# Patient Record
Sex: Male | Born: 1940 | Race: Black or African American | Hispanic: No | Marital: Single | Smoking: Never smoker
Health system: Southern US, Community
[De-identification: ages and names within clinical notes are randomized; demographics above are authoritative.]

## PROBLEM LIST (undated history)

## (undated) DIAGNOSIS — I10 Essential (primary) hypertension: Secondary | ICD-10-CM

## (undated) HISTORY — DX: Essential (primary) hypertension: I10

## (undated) HISTORY — PX: DENTAL RESTORATION/EXTRACTION WITH X-RAY: SHX5796

---

## 2003-07-06 LAB — HM COLONOSCOPY

## 2003-12-21 ENCOUNTER — Emergency Department (HOSPITAL_COMMUNITY): Admission: EM | Admit: 2003-12-21 | Discharge: 2003-12-21 | Payer: Self-pay | Admitting: Family Medicine

## 2003-12-24 ENCOUNTER — Emergency Department (HOSPITAL_COMMUNITY): Admission: EM | Admit: 2003-12-24 | Discharge: 2003-12-24 | Payer: Self-pay | Admitting: Family Medicine

## 2004-04-24 ENCOUNTER — Emergency Department (HOSPITAL_COMMUNITY): Admission: EM | Admit: 2004-04-24 | Discharge: 2004-04-24 | Payer: Self-pay | Admitting: Family Medicine

## 2004-05-01 ENCOUNTER — Emergency Department (HOSPITAL_COMMUNITY): Admission: EM | Admit: 2004-05-01 | Discharge: 2004-05-01 | Payer: Self-pay | Admitting: Family Medicine

## 2004-05-05 ENCOUNTER — Ambulatory Visit: Payer: Self-pay | Admitting: Internal Medicine

## 2004-05-11 ENCOUNTER — Ambulatory Visit: Payer: Self-pay | Admitting: Family Medicine

## 2005-10-04 ENCOUNTER — Ambulatory Visit: Payer: Self-pay | Admitting: Internal Medicine

## 2005-10-18 ENCOUNTER — Ambulatory Visit: Payer: Self-pay | Admitting: Family Medicine

## 2005-12-06 ENCOUNTER — Ambulatory Visit: Payer: Self-pay | Admitting: Family Medicine

## 2005-12-06 LAB — CONVERTED CEMR LAB: PSA: 0.94 ng/mL

## 2005-12-13 ENCOUNTER — Ambulatory Visit: Payer: Self-pay | Admitting: Family Medicine

## 2006-01-03 ENCOUNTER — Ambulatory Visit: Payer: Self-pay | Admitting: Family Medicine

## 2007-01-23 ENCOUNTER — Ambulatory Visit: Payer: Self-pay | Admitting: Family Medicine

## 2007-01-23 DIAGNOSIS — M109 Gout, unspecified: Secondary | ICD-10-CM | POA: Insufficient documentation

## 2007-01-26 ENCOUNTER — Encounter: Payer: Self-pay | Admitting: Family Medicine

## 2007-01-26 DIAGNOSIS — I10 Essential (primary) hypertension: Secondary | ICD-10-CM

## 2007-01-27 ENCOUNTER — Ambulatory Visit: Payer: Self-pay | Admitting: Family Medicine

## 2007-02-21 ENCOUNTER — Encounter: Payer: Self-pay | Admitting: Family Medicine

## 2007-03-17 ENCOUNTER — Ambulatory Visit: Payer: Self-pay | Admitting: Family Medicine

## 2007-03-17 LAB — CONVERTED CEMR LAB
ALT: 19 units/L (ref 0–53)
AST: 24 units/L (ref 0–37)
Albumin: 3.7 g/dL (ref 3.5–5.2)
Basophils Absolute: 0 10*3/uL (ref 0.0–0.1)
Calcium: 9.2 mg/dL (ref 8.4–10.5)
Chloride: 103 meq/L (ref 96–112)
Creatinine, Ser: 1.3 mg/dL (ref 0.4–1.5)
Eosinophils Absolute: 0.1 10*3/uL (ref 0.0–0.6)
Eosinophils Relative: 1.5 % (ref 0.0–5.0)
GFR calc non Af Amer: 59 mL/min
Glucose, Bld: 90 mg/dL (ref 70–99)
HCT: 36.7 % — ABNORMAL LOW (ref 39.0–52.0)
LDL Cholesterol: 108 mg/dL — ABNORMAL HIGH (ref 0–99)
MCV: 94.7 fL (ref 78.0–100.0)
Neutrophils Relative %: 57.7 % (ref 43.0–77.0)
Platelets: 361 10*3/uL (ref 150–400)
RBC: 3.87 M/uL — ABNORMAL LOW (ref 4.22–5.81)
RDW: 12.4 % (ref 11.5–14.6)
Total Bilirubin: 1.7 mg/dL — ABNORMAL HIGH (ref 0.3–1.2)
Total CHOL/HDL Ratio: 3.7
Triglycerides: 43 mg/dL (ref 0–149)
WBC: 5.5 10*3/uL (ref 4.5–10.5)

## 2007-03-24 ENCOUNTER — Ambulatory Visit: Payer: Self-pay | Admitting: Family Medicine

## 2007-06-19 ENCOUNTER — Ambulatory Visit: Payer: Self-pay | Admitting: Family Medicine

## 2007-06-19 DIAGNOSIS — D649 Anemia, unspecified: Secondary | ICD-10-CM | POA: Insufficient documentation

## 2007-06-19 LAB — CONVERTED CEMR LAB
Eosinophils Absolute: 0.1 10*3/uL (ref 0.0–0.6)
Eosinophils Relative: 1.8 % (ref 0.0–5.0)
HCT: 43.6 % (ref 39.0–52.0)
Hemoglobin: 14.7 g/dL (ref 13.0–17.0)
Lymphocytes Relative: 24.4 % (ref 12.0–46.0)
MCHC: 33.7 g/dL (ref 30.0–36.0)
MCV: 96.6 fL (ref 78.0–100.0)
Monocytes Absolute: 0.6 10*3/uL (ref 0.2–0.7)
Neutro Abs: 3.6 10*3/uL (ref 1.4–7.7)
Neutrophils Relative %: 62.9 % (ref 43.0–77.0)
WBC: 5.7 10*3/uL (ref 4.5–10.5)

## 2007-08-14 ENCOUNTER — Ambulatory Visit: Payer: Self-pay | Admitting: Family Medicine

## 2007-08-14 DIAGNOSIS — N63 Unspecified lump in unspecified breast: Secondary | ICD-10-CM | POA: Insufficient documentation

## 2008-05-21 ENCOUNTER — Telehealth: Payer: Self-pay | Admitting: Family Medicine

## 2008-05-23 ENCOUNTER — Ambulatory Visit: Payer: Self-pay | Admitting: Family Medicine

## 2008-06-21 ENCOUNTER — Emergency Department (HOSPITAL_COMMUNITY): Admission: EM | Admit: 2008-06-21 | Discharge: 2008-06-21 | Payer: Self-pay | Admitting: Emergency Medicine

## 2009-03-31 ENCOUNTER — Telehealth: Payer: Self-pay | Admitting: Family Medicine

## 2009-09-15 ENCOUNTER — Ambulatory Visit: Payer: Self-pay | Admitting: Family Medicine

## 2009-09-15 LAB — CONVERTED CEMR LAB
Albumin: 3.7 g/dL (ref 3.5–5.2)
Alkaline Phosphatase: 72 units/L (ref 39–117)
Basophils Absolute: 0 10*3/uL (ref 0.0–0.1)
Basophils Relative: 0.2 % (ref 0.0–3.0)
Bilirubin, Direct: 0.1 mg/dL (ref 0.0–0.3)
Calcium: 9 mg/dL (ref 8.4–10.5)
GFR calc non Af Amer: 85.91 mL/min (ref >60)
HCT: 40.2 % (ref 39.0–52.0)
HDL: 62.2 mg/dL (ref >39.00)
Hemoglobin: 13.6 g/dL (ref 13.0–17.0)
LDL Cholesterol: 96 mg/dL (ref 0–99)
Lymphs Abs: 1.3 10*3/uL (ref 0.7–4.0)
MCHC: 33.7 g/dL (ref 30.0–36.0)
Monocytes Relative: 13 % — ABNORMAL HIGH (ref 3.0–12.0)
Neutro Abs: 2.3 10*3/uL (ref 1.4–7.7)
Nitrite: NEGATIVE
Protein, U semiquant: NEGATIVE
RDW: 13.2 % (ref 11.5–14.6)
Sodium: 142 meq/L (ref 135–145)
Total CHOL/HDL Ratio: 3
VLDL: 18.8 mg/dL (ref 0.0–40.0)

## 2009-10-06 ENCOUNTER — Ambulatory Visit: Payer: Self-pay | Admitting: Family Medicine

## 2009-11-04 IMAGING — CR DG RIBS W/ CHEST 3+V*R*
5 series · 5 of 5 positions shown · non-contrast
Comparison: None.

CLINICAL DATA: 57-year-old male status post fall on the right side
with rib pain.

RIGHT RIBS AND CHEST - 3+ VIEW

[w chest pa]
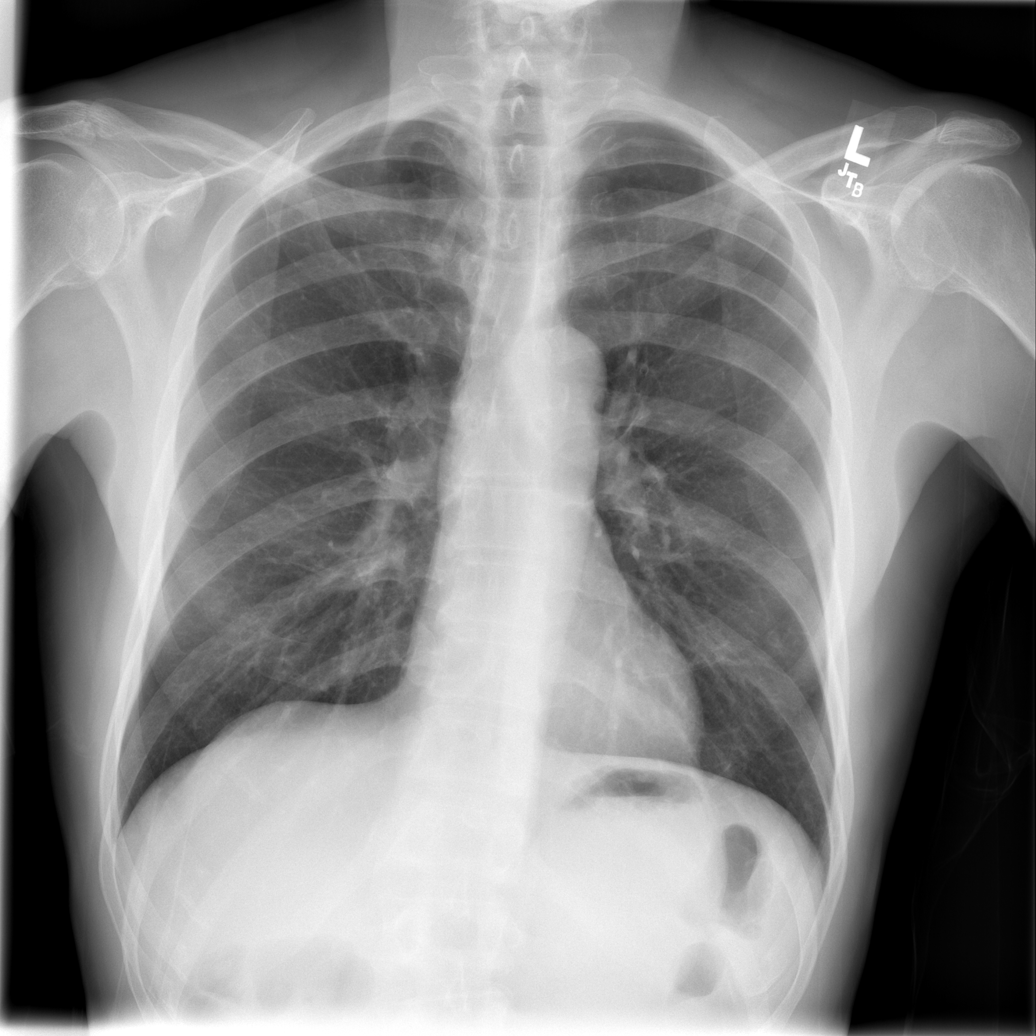

[w ribs ap/pa upper right]
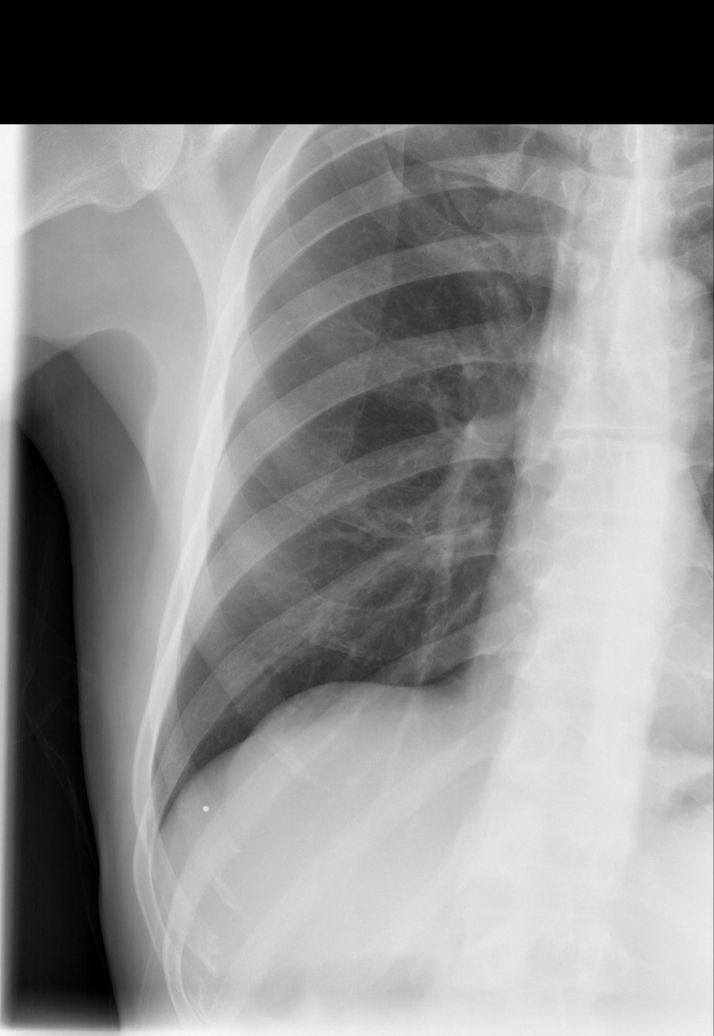

[w ribs ap/pa lower right]
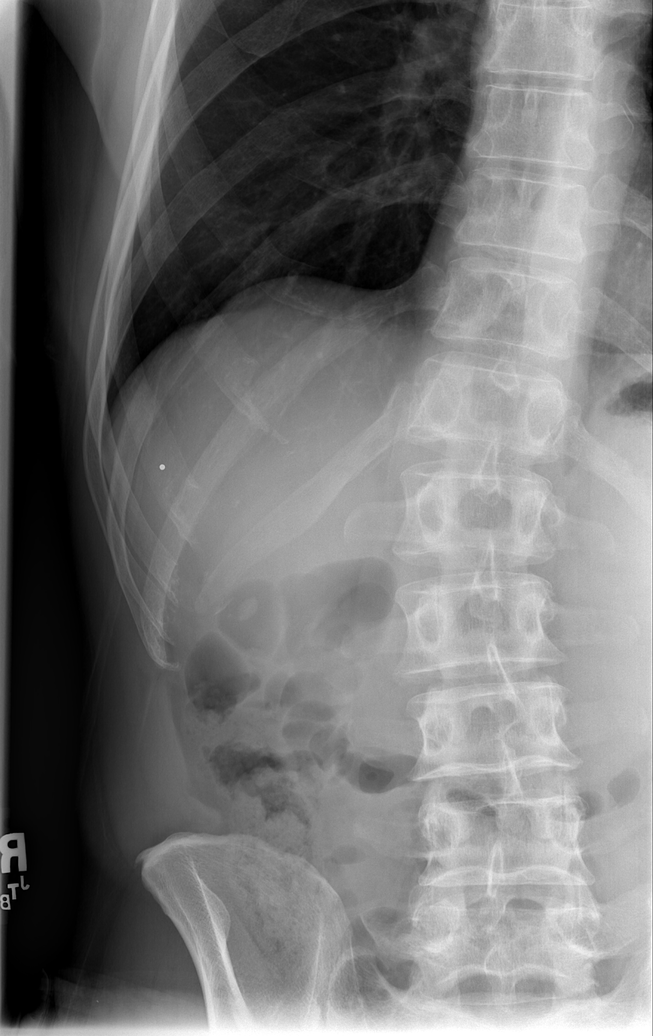

[w ribs oblique right (1 of 2)]
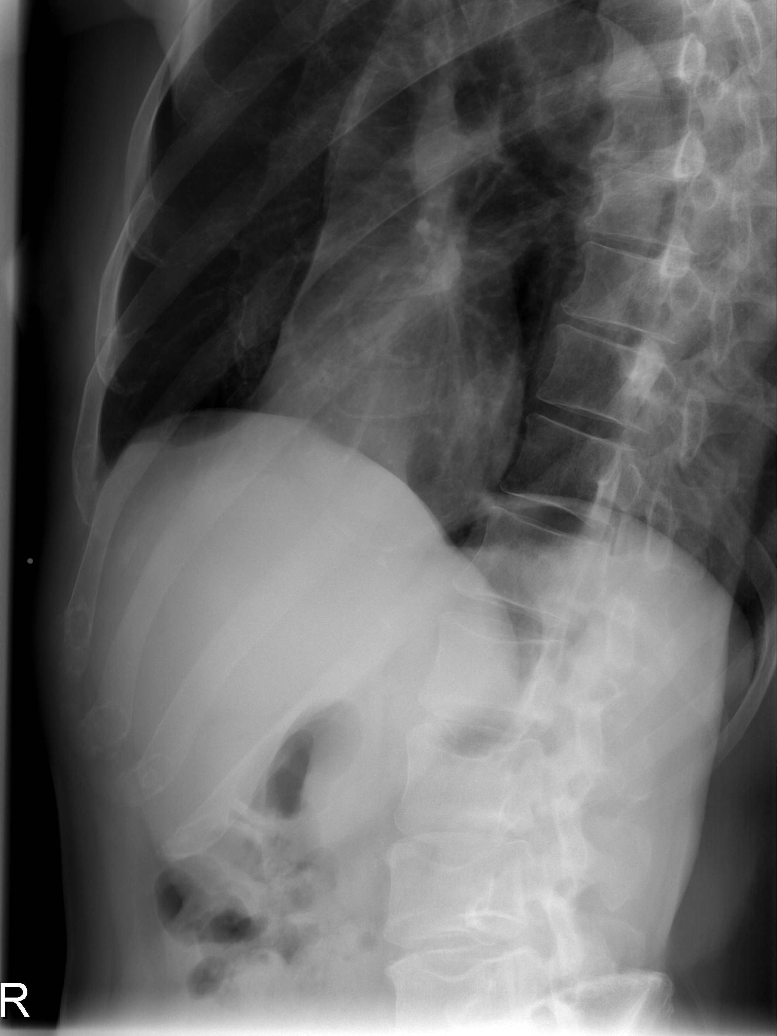

[w ribs oblique right (2 of 2)]
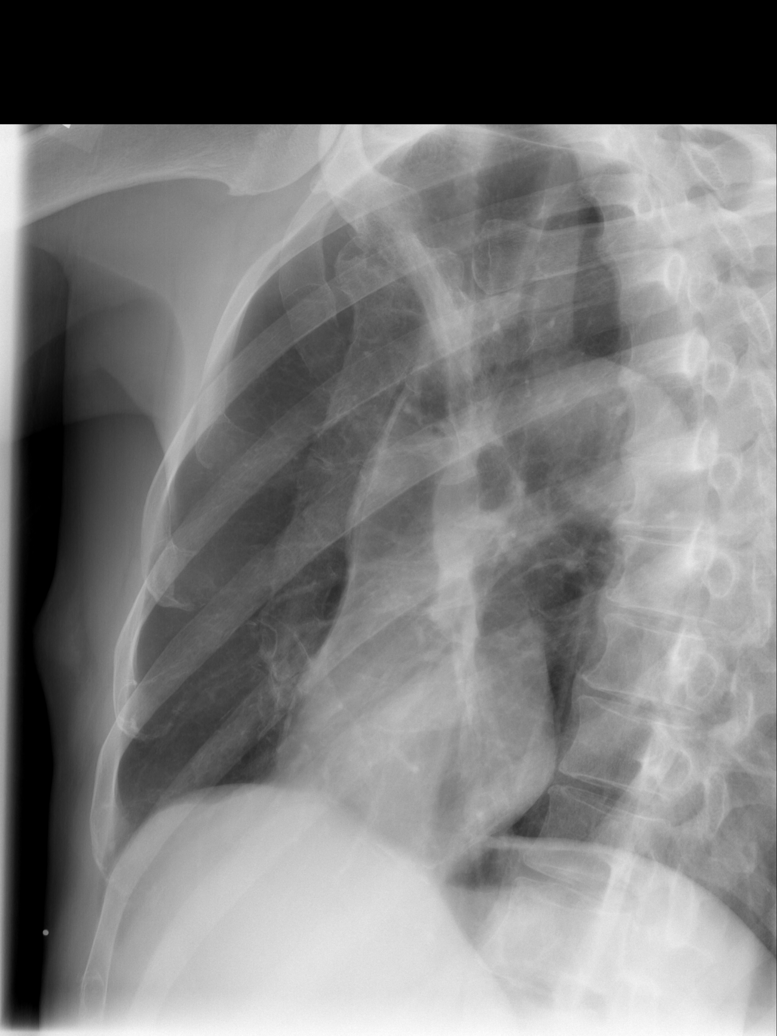

[5 of 5 positions shown; findings below may reference images not displayed]

FINDINGS: Normal cardiac size and mediastinal contours.  Lung
volumes are within normal limits to mildly increased.  No
pneumothorax, pulmonary edema, pleural effusion, consolidation or
confluent airspace opacity.

BB marker was placed at the anterior right seventh rib level.  Mild
thoracolumbar scoliosis is noted.  Mild degenerative changes in the
spine.  Irregularity at the anterior seventh rib could be
physiologic costochondral calcification and is similar to the other
levels. No acute displaced rib fracture identified.  No acute
osseous abnormality identified.
IMPRESSION: 1. No acute cardiopulmonary abnormality.
2. No acute displaced rib fracture identified.  Clinical area of
concern is at the anterior right seventh costochondral junction.

## 2010-05-29 ENCOUNTER — Emergency Department (HOSPITAL_COMMUNITY): Admission: EM | Admit: 2010-05-29 | Discharge: 2010-05-29 | Payer: Self-pay | Admitting: Family Medicine

## 2010-08-02 LAB — CONVERTED CEMR LAB
AST: 27 units/L (ref 0–37)
Albumin: 3.8 g/dL (ref 3.5–5.2)
Alkaline Phosphatase: 62 units/L (ref 39–117)
BUN: 16 mg/dL (ref 6–23)
Basophils Relative: 0.4 % (ref 0.0–3.0)
Eosinophils Relative: 1.7 % (ref 0.0–5.0)
GFR calc Af Amer: 86 mL/min
Glucose, Bld: 98 mg/dL (ref 70–99)
HCT: 39.8 % (ref 39.0–52.0)
HDL: 59.8 mg/dL (ref >39.0)
Hemoglobin: 13.7 g/dL (ref 13.0–17.0)
Monocytes Absolute: 0.7 10*3/uL (ref 0.1–1.0)
Monocytes Relative: 13.6 % — ABNORMAL HIGH (ref 3.0–12.0)
Platelets: 219 10*3/uL (ref 150–400)
Potassium: 4.1 meq/L (ref 3.5–5.1)
RBC: 4.13 M/uL — ABNORMAL LOW (ref 4.22–5.81)
TSH: 1.3 microintl units/mL (ref 0.35–5.50)
Total CHOL/HDL Ratio: 2.6
Total Protein: 7.6 g/dL (ref 6.0–8.3)
WBC: 5 10*3/uL (ref 4.5–10.5)

## 2010-08-04 NOTE — Assessment & Plan Note (Signed)
Summary: CPX // RS   Vital Signs:  Patient profile:   70 year old male Height:      74 inches Weight:      161 pounds Temp:     98.0 degrees F oral BP sitting:   120 / 84  (left arm) Cuff size:   regular  Vitals Entered By: Kern Reap CMA Duncan Dull) (October 06, 2009 3:18 PM) CC: cpx   CC:  cpx.  History of Present Illness: Leroy Fuller is a 70 year old male, nonsmoker, who comes in today for evaluation of gout and hypertension.  His count history without Purinol 300 mg daily.  No attacks in the last year.  His hypertension is to return reticulocyte 50 -- 25 does one half tablet daily.  BP 120/84.  He gets routine eye care.  Dental care.  Colonoscopy normal, tetanus, 2005, Pneumovax 2009, declines at flu shot  Allergies (verified): No Known Drug Allergies  Past History:  Past medical, surgical, family and social histories (including risk factors) reviewed, and no changes noted (except as noted below).  Past Medical History: Reviewed history from 01/27/2007 and no changes required. Hypertension gout  Past Surgical History: Reviewed history from 01/26/2007 and no changes required. Denies surgical history  Family History: Reviewed history from 08/14/2007 and no changes required. gout Family History Hypertension  Social History: Reviewed history from 08/14/2007 and no changes required. 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 No apparent alopecia or balding. Eyes:  No corneal or conjunctival inflammation noted. EOMI. Perrla. Funduscopic exam benign, without hemorrhages, exudates or papilledema. Vision grossly normal. Ears:  External ear exam shows no significant lesions or  deformities.  Otoscopic examination reveals clear canals, tympanic membranes are intact bilaterally without bulging, retraction, inflammation or discharge. Hearing is grossly normal bilaterally. Nose:  External nasal examination shows no deformity or inflammation. Nasal mucosa are pink and moist without lesions or exudates. Mouth:  Oral mucosa and oropharynx without lesions or exudates.  Teeth in good repair. Neck:  No deformities, masses, or tenderness noted. Chest Wall:  No deformities, masses, tenderness or gynecomastia noted. Breasts:  No masses or gynecomastia noted Lungs:  Normal respiratory effort, chest expands symmetrically. Lungs are clear to auscultation, no crackles or wheezes. Heart:  Normal rate and regular rhythm. S1 and S2 normal without gallop, murmur, click, rub or other extra sounds. Abdomen:  Bowel sounds positive,abdomen soft and non-tender without masses, organomegaly or hernias noted. Rectal:  No external abnormalities noted. Normal sphincter tone. No rectal masses or tenderness. Genitalia:  Testes bilaterally descended without nodularity, tenderness or masses. No scrotal masses or lesions. No penis lesions or urethral discharge. Prostate:  Prostate gland firm and smooth, no enlargement, nodularity, tenderness, mass, asymmetry or induration. Msk:  No deformity or scoliosis noted of thoracic or lumbar spine.   Pulses:  R and L carotid,radial,femoral,dorsalis pedis and posterior tibial pulses are full and equal bilaterally Extremities:  No clubbing, cyanosis, edema, or deformity noted with normal full range of motion of all joints.   Neurologic:  No cranial nerve deficits noted. Station and gait are normal. Plantar reflexes are down-going bilaterally. DTRs are symmetrical throughout. Sensory, motor and coordinative functions appear intact. Skin:  Intact without suspicious lesions or rashes Cervical Nodes:  No lymphadenopathy noted Axillary Nodes:  No palpable  lymphadenopathy Inguinal Nodes:  No significant adenopathy Psych:  Cognition and judgment appear intact. Alert and cooperative with normal attention span and concentration. No apparent delusions, illusions, hallucinations   Impression & Recommendations:  Problem # 1:  HYPERTENSION (ICD-401.9) Assessment Improved  His updated medication list for this problem includes:    Tenoretic 50 50-25 Mg Tabs (Atenolol-chlorthalidone) .Marland Kitchen... 1/2 tab once daily  Orders: EKG w/ Interpretation (93000) Prescription Created Electronically [phone])  Problem # 2:  GOUT (ICD-274.9) Assessment: Improved  The following medications were removed from the medication list:    Colchicine 0.6 Mg Tabs (Colchicine) .Marland Kitchen... Take 1 tablet by mouth once a day    Zyloprim 300 Mg Tabs (Allopurinol) ..... Stopped-----------------take 1 tablet by mouth every morning- His updated medication list for this problem includes:    Allopurinol 300 Mg Tabs (Allopurinol) .Marland Kitchen... Take one tab qd  Orders: Prescription Created Electronically [phone])  Problem # 3:  Preventive Health Care (ICD-V70.0) Assessment: Unchanged  Complete Medication List: 1)  Tenoretic 50 50-25 Mg Tabs (Atenolol-chlorthalidone) .... 1/2 tab once daily 2)  Allopurinol 300 Mg Tabs (Allopurinol) .... Take one tab qd  Patient Instructions: 1)  Please schedule a follow-up appointment in 1 year. 2)  Take an Aspirin every day. Prescriptions: ALLOPURINOL 300 MG TABS (ALLOPURINOL) take one tab qd  #100 x 3   Entered and Authorized by:   Roderick Pee MD   Signed by:   Roderick Pee MD on 10/06/2009   Method used:   [facility]        [facility] [address] [phone]* (retail)       [address]       [place], [place]  [address]       Ph: [phone] or [phone]       Fax: [phone]   RxID:   [id number] TENORETIC 50 50-25 MG  TABS (ATENOLOL-CHLORTHALIDONE) 1/2 tab once daily  #50 x 3   Entered and Authorized by:    Roderick Pee MD   Signed by:   Roderick Pee MD on 10/06/2009   Method used:   [facility]        [facility] [address] [phone]* (retail)       [address]       [place], [place]  [address]       Ph: [phone] or [phone]       Fax: [phone]   RxID:   [id number]

## 2010-08-04 NOTE — Assessment & Plan Note (Signed)
Summary: BP MED CHECK//CCM   Vital Signs:  Patient profile:   70 year old male Height:      74 inches Weight:      163 pounds BMI:     21.00 Temp:     98.0 degrees F oral BP sitting:   142 / 92  (left arm) Cuff size:   regular  Vitals Entered By: Kern Reap CMA Duncan Dull) (September 15, 2009 1:56 PM)  Reason for Visit follow up meds  History of Present Illness: Leroy Fuller is a 70 year old male, who comes in today for violation of hypertension, and gout.  His last evaluation here was over 12 months ago.  He fell he came in because we would not refill this medication anymore without an office visit.  His blood pressure on Tenoretic 50 -- 25 does one half tab q.a.m., is normal at home.  BP today 142/92 however, he skipped this morning's medication.  He takes allopurinol 300 mg daily.  With this.  He has no gouty attacks  Allergies: No Known Drug Allergies PMH-FH-SH reviewed for relevance  Review of Systems      See HPI  Physical Exam  General:  Well-developed,well-nourished,in no acute distress; alert,appropriate and cooperative throughout examination   Impression & Recommendations:  Problem # 1:  HYPERTENSION (ICD-401.9) Assessment Deteriorated  His updated medication list for this problem includes:    Tenoretic 50 50-25 Mg Tabs (Atenolol-chlorthalidone) .Marland Kitchen... 1/2 tab once daily  Orders: Venipuncture (47829) Prescription Created Electronically [phone]) UA Dipstick w/o Micro (automated)  (81003) TLB-Lipid Panel (80061-LIPID) TLB-BMP (Basic Metabolic Panel-BMET) (80048-METABOL) TLB-Hepatic/Liver Function Pnl (80076-HEPATIC) TLB-CBC Platelet - w/Differential (85025-CBCD) TLB-TSH (Thyroid Stimulating Hormone) (84443-TSH) TLB-PSA (Prostate Specific Antigen) (84153-PSA)  Problem # 2:  GOUT (ICD-274.9) Assessment: Improved  His updated medication list for this problem includes:    Colchicine 0.6 Mg Tabs (Colchicine) .Marland Kitchen... Take 1 tablet by mouth once a day    Zyloprim 300 Mg  Tabs (Allopurinol) ..... Stopped-----------------take 1 tablet by mouth every morning-    Allopurinol 300 Mg Tabs (Allopurinol) .Marland Kitchen... Take one tab qd  Orders: Venipuncture (08657) Prescription Created Electronically [phone]) TLB-Lipid Panel (80061-LIPID) TLB-BMP (Basic Metabolic Panel-BMET) (80048-METABOL) TLB-Hepatic/Liver Function Pnl (80076-HEPATIC) TLB-CBC Platelet - w/Differential (85025-CBCD) TLB-TSH (Thyroid Stimulating Hormone) (84443-TSH) TLB-PSA (Prostate Specific Antigen) (84153-PSA) TLB-Uric Acid, Blood (84550-URIC)  Complete Medication List: 1)  Colchicine 0.6 Mg Tabs (Colchicine) .... Take 1 tablet by mouth once a day 2)  Tenoretic 50 50-25 Mg Tabs (Atenolol-chlorthalidone) .... 1/2 tab once daily 3)  Zyloprim 300 Mg Tabs (Allopurinol) .... Stopped-----------------take 1 tablet by mouth every morning- 4)  Allopurinol 300 Mg Tabs (Allopurinol) .... Take one tab qd  Patient Instructions: 1)  restart your medication.  Return for a 30 minute appointment April, the fourth ........Marland Kitchen we would do all y  labwork today Prescriptions: ALLOPURINOL 300 MG TABS (ALLOPURINOL) take one tab qd  #100 x 3   Entered and Authorized by:   Roderick Pee MD   Signed by:   Roderick Pee MD on 09/15/2009   Method used:   [facility]        [facility] [address] [phone]* (retail)       [address]       [place], [place]  [address]       Ph: [phone] or [phone]       Fax: [phone]   RxID:   [id number] TENORETIC 50 50-25 MG  TABS (ATENOLOL-CHLORTHALIDONE)  1/2 tab once daily  #50 x 3   Entered and Authorized by:   Roderick Pee MD   Signed by:   Roderick Pee MD on 09/15/2009   Method used:   [facility]ronically to        [facility] [address] [phone]* (retail)       [address]       [place], [place]  [address]       Ph: [phone] or [phone]       Fax: [phone]   RxID:    [id number]   Laboratory Results   Urine Tests  Date/Time Recieved: September 15, 2009 2:39 PM  Date/Time Reported: September 15, 2009 2:39 PM   Routine Urinalysis   Color: yellow Appearance: Clear Glucose: negative   (Normal Range: Negative) Bilirubin: negative   (Normal Range: Negative) Ketone: negative   (Normal Range: Negative) Spec. Gravity: 1.010   (Normal Range: 1.003-1.035) Blood: trace-intact   (Normal Range: Negative) pH: 6.0   (Normal Range: 5.0-8.0) Protein: negative   (Normal Range: Negative) Urobilinogen: 1.0   (Normal Range: 0-1) Nitrite: negative   (Normal Range: Negative) Leukocyte Esterace: 2+   (Normal Range: Negative)    Comments: Wynona Canes, CMA  September 15, 2009 2:39 PM

## 2010-10-13 ENCOUNTER — Other Ambulatory Visit: Payer: Self-pay | Admitting: Family Medicine

## 2010-10-15 ENCOUNTER — Other Ambulatory Visit: Payer: Self-pay | Admitting: *Deleted

## 2010-10-15 MED ORDER — ATENOLOL-CHLORTHALIDONE 50-25 MG PO TABS: ORAL_TABLET | ORAL | Status: DC

## 2010-10-22 ENCOUNTER — Other Ambulatory Visit: Payer: Self-pay | Admitting: *Deleted

## 2010-10-22 MED ORDER — ATENOLOL-CHLORTHALIDONE 50-25 MG PO TABS: ORAL_TABLET | ORAL | Status: DC

## 2010-10-22 NOTE — Telephone Encounter (Signed)
patient  States he is unable to get his Rx because it is on back order and would like for it to be sent to a different pharmacy.

## 2010-11-26 ENCOUNTER — Other Ambulatory Visit: Payer: Self-pay | Admitting: Family Medicine

## 2010-11-26 MED ORDER — ATENOLOL-CHLORTHALIDONE 50-25 MG PO TABS: ORAL_TABLET | ORAL | Status: DC

## 2010-11-26 NOTE — Telephone Encounter (Signed)
Pt needs refill on atenolol 50-25mg   call into [facility] [address] [phone]

## 2010-12-30 ENCOUNTER — Other Ambulatory Visit (INDEPENDENT_AMBULATORY_CARE_PROVIDER_SITE_OTHER): Payer: Commercial Managed Care - PPO

## 2010-12-30 DIAGNOSIS — Z0000 Encounter for general adult medical examination without abnormal findings: Secondary | ICD-10-CM

## 2010-12-30 LAB — CBC WITH DIFFERENTIAL/PLATELET
Basophils Absolute: 0 10*3/uL (ref 0.0–0.1)
Basophils Relative: 0.8 % (ref 0.0–3.0)
Eosinophils Absolute: 0.1 10*3/uL (ref 0.0–0.7)
HCT: 42 % (ref 39.0–52.0)
Hemoglobin: 14.5 g/dL (ref 13.0–17.0)
Lymphocytes Relative: 43.2 % (ref 12.0–46.0)
Lymphs Abs: 1.6 10*3/uL (ref 0.7–4.0)
MCHC: 34.5 g/dL (ref 30.0–36.0)
MCV: 99.1 fl (ref 78.0–100.0)
Monocytes Absolute: 0.5 10*3/uL (ref 0.1–1.0)
Neutro Abs: 1.4 10*3/uL (ref 1.4–7.7)
RDW: 12.8 % (ref 11.5–14.6)

## 2010-12-30 LAB — LIPID PANEL
Cholesterol: 194 mg/dL (ref 0–200)
HDL: 51.7 mg/dL (ref >39.00)
Triglycerides: 213 mg/dL — ABNORMAL HIGH (ref 0.0–149.0)
VLDL: 42.6 mg/dL — ABNORMAL HIGH (ref 0.0–40.0)

## 2010-12-30 LAB — BASIC METABOLIC PANEL
CO2: 30 mEq/L (ref 19–32)
Calcium: 9.1 mg/dL (ref 8.4–10.5)
Chloride: 102 mEq/L (ref 96–112)
Glucose, Bld: 86 mg/dL (ref 70–99)
Sodium: 140 mEq/L (ref 135–145)

## 2010-12-30 LAB — POCT URINALYSIS DIPSTICK
Blood, UA: NEGATIVE
Ketones, UA: NEGATIVE
Protein, UA: NEGATIVE
Spec Grav, UA: 1.01
Urobilinogen, UA: 0.2

## 2010-12-30 LAB — HEPATIC FUNCTION PANEL
Albumin: 4.3 g/dL (ref 3.5–5.2)
Total Protein: 7.7 g/dL (ref 6.0–8.3)

## 2010-12-30 LAB — TSH: TSH: 1.29 u[IU]/mL (ref 0.35–5.50)

## 2011-01-14 ENCOUNTER — Encounter: Payer: Self-pay | Admitting: Family Medicine

## 2011-01-18 ENCOUNTER — Encounter: Payer: Self-pay | Admitting: Family Medicine

## 2011-01-18 ENCOUNTER — Ambulatory Visit (INDEPENDENT_AMBULATORY_CARE_PROVIDER_SITE_OTHER)
Admission: RE | Admit: 2011-01-18 | Discharge: 2011-01-18 | Disposition: A | Discharge status: Home / Self Care | Payer: Commercial Managed Care - PPO | Source: Ambulatory Visit | Attending: Family Medicine | Admitting: Family Medicine

## 2011-01-18 ENCOUNTER — Other Ambulatory Visit: Payer: Self-pay | Admitting: Family Medicine

## 2011-01-18 ENCOUNTER — Ambulatory Visit (INDEPENDENT_AMBULATORY_CARE_PROVIDER_SITE_OTHER): Payer: Commercial Managed Care - PPO | Admitting: Family Medicine

## 2011-01-18 DIAGNOSIS — M109 Gout, unspecified: Secondary | ICD-10-CM

## 2011-01-18 DIAGNOSIS — I10 Essential (primary) hypertension: Secondary | ICD-10-CM

## 2011-01-18 MED ORDER — ALLOPURINOL 300 MG PO TABS: 300.0000 mg | ORAL_TABLET | Freq: Every day | ORAL | Status: DC

## 2011-01-18 MED ORDER — ATENOLOL-CHLORTHALIDONE 50-25 MG PO TABS: ORAL_TABLET | ORAL | Status: DC

## 2011-01-18 NOTE — Patient Instructions (Signed)
Continue your current medications.  Follow-up in one year or sooner if any problem 

## 2011-01-18 NOTE — Progress Notes (Signed)
  Subjective:    Patient ID: Leroy Fuller, male    DOB: [birth year], 70 y.o.   MRN: [id number]  HPI  Leroy Fuller is a 70 year old single male, nonsmoker, who comes in today for a Medicare wellness examination because of a history of underlying hypertension and gout.  His hypertension has been treated with Tenoretic 50 -- 25 does one half tab q.a.m., BP 130/84.  He takes allopurinol 300 mg daily and rest with this has no joint pain.  He gets routine eye care, hearing normal, regular dental care, colonoscopy, normal, tetanus, 2005, Pneumovax 2009, information given on shingles, activities of daily living.  Normal.  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  He manages all his own financial affairs.  Home safety reviewed.  There is no issues identified, no guns in the house, he does have a healthcare power of attorney and living will    Review of Systems  Constitutional: Negative.   HENT: Negative.   Eyes: Negative.   Respiratory: Negative.   Cardiovascular: Negative.   Gastrointestinal: Negative.   Genitourinary: Negative.   Musculoskeletal: Negative.   Skin: Negative.   Neurological: Negative.   Hematological: Negative.   Psychiatric/Behavioral: Negative.        Objective:   Physical Exam  Constitutional: He is oriented to person, place, and time. He appears well-developed and well-nourished.  HENT:  Head: Normocephalic and atraumatic.  Right Ear: External ear normal.  Left Ear: External ear normal.  Nose: Nose normal.  Mouth/Throat: Oropharynx is clear and moist.  Eyes: Conjunctivae and EOM are normal. Pupils are equal, round, and reactive to light.  Neck: Normal range of motion. Neck supple. No JVD present. No tracheal deviation present. No thyromegaly present.  Cardiovascular: Normal rate, regular rhythm, normal heart sounds and intact distal pulses.  Exam reveals no gallop and no friction rub.   No murmur heard. Pulmonary/Chest: Effort normal and breath sounds normal.  No stridor. No respiratory distress. He has no wheezes. He has no rales. He exhibits no tenderness.  Abdominal: Soft. Bowel sounds are normal. He exhibits no distension and no mass. There is no tenderness. There is no rebound and no guarding.  Genitourinary: Rectum normal and penis normal. Guaiac negative stool. No penile tenderness.       3+ symmetrical.  BPH  Musculoskeletal: Normal range of motion. He exhibits no edema and no tenderness.  Lymphadenopathy:    He has no cervical adenopathy.  Neurological: He is alert and oriented to person, place, and time. He has normal reflexes. No cranial nerve deficit. He exhibits normal muscle tone.  Skin: Skin is warm and dry. No rash noted. No erythema. No pallor.  Psychiatric: He has a normal mood and affect. His behavior is normal. Judgment and thought content normal.          Assessment & Plan:  Healthy male.  Hypertension.  Continue Tenoretic, dose one half tab daily.  History of hyperuricemia and gout,, continue allopurinol.  BPH asymptomatic observed.  Follow-up in one year, sooner if any problem

## 2011-03-01 ENCOUNTER — Encounter: Payer: Self-pay | Admitting: Family Medicine

## 2011-08-20 ENCOUNTER — Encounter (HOSPITAL_COMMUNITY): Payer: Self-pay | Admitting: Emergency Medicine

## 2011-08-20 ENCOUNTER — Emergency Department (INDEPENDENT_AMBULATORY_CARE_PROVIDER_SITE_OTHER): Payer: Commercial Managed Care - PPO

## 2011-08-20 ENCOUNTER — Emergency Department (HOSPITAL_COMMUNITY)
Admission: EM | Admit: 2011-08-20 | Discharge: 2011-08-20 | Disposition: A | Discharge status: Home / Self Care | Payer: Commercial Managed Care - PPO | Source: Home / Self Care | Attending: Family Medicine | Admitting: Family Medicine

## 2011-08-20 DIAGNOSIS — M25462 Effusion, left knee: Secondary | ICD-10-CM

## 2011-08-20 DIAGNOSIS — M25469 Effusion, unspecified knee: Secondary | ICD-10-CM

## 2011-08-20 MED ORDER — DICLOFENAC POTASSIUM 50 MG PO TABS: 50.0000 mg | ORAL_TABLET | Freq: Three times a day (TID) | ORAL | Status: DC

## 2011-08-20 NOTE — ED Provider Notes (Signed)
History     CSN: [id number]  Arrival date & time 08/20/11  1224   None     Chief Complaint  Patient presents with  . Knee Pain    (Consider location/radiation/quality/duration/timing/severity/associated sxs/prior treatment) Patient is a 71 y.o. male presenting with knee pain. The history is provided by the patient.  Knee Pain This is a chronic problem. The current episode started more than 1 week ago (3 wk hx of pain and swelling of left knee.). The problem has not changed since onset.The symptoms are aggravated by walking.    Past Medical History  Diagnosis Date  . Hypertension   . Gout     History reviewed. No pertinent past surgical history.  Family History  Problem Relation Age of Onset  . Gout      fhx  . Hypertension      fhx    History  Substance Use Topics  . Smoking status: Never Smoker   . Smokeless tobacco: Not on file  . Alcohol Use: Yes      Review of Systems  Constitutional: Negative.   Musculoskeletal: Positive for joint swelling and gait problem.    Allergies  Review of patient's allergies indicates no known allergies.  Home Medications   Current Outpatient Rx  Name Route Sig Dispense Refill  . ALLOPURINOL 300 MG PO TABS Oral Take 1 tablet (300 mg total) by mouth daily. 100 tablet 3  . ATENOLOL-CHLORTHALIDONE 50-25 MG PO TABS  Half tab daily  50 tablet 3    Time for an office visit  . DICLOFENAC POTASSIUM 50 MG PO TABS Oral Take 1 tablet (50 mg total) by mouth 3 (three) times daily. 30 tablet 0    BP 125/77  Pulse 72  Temp(Src) 97.9 F (36.6 C) (Oral)  Resp 16  SpO2 96%  Physical Exam  Nursing note and vitals reviewed. Constitutional: He is oriented to person, place, and time. He appears well-developed and well-nourished.  Musculoskeletal: He exhibits edema and tenderness.       Left knee: He exhibits effusion. He exhibits normal range of motion, no ecchymosis, no deformity, no erythema, normal alignment, no LCL laxity, normal  patellar mobility, normal meniscus and no MCL laxity. tenderness found.  Neurological: He is alert and oriented to person, place, and time.  Skin: Skin is warm and dry.    ED Course  Procedures (including critical care time)  Labs Reviewed - No data to display Dg Knee Complete 4 Views Left  08/20/2011  *RADIOLOGY REPORT*  Clinical Data: Left knee pain.  No injury.  LEFT KNEE - COMPLETE 4+ VIEW  Comparison: 05/29/2010  Findings: There is cortical irregularity within the medial aspect of the medial femoral condyle, new since prior study. This is more peripheral than expected for osteochondral injury and may reflect a subchondral stress fracture or spontaneous osteonecrosis. Moderate joint effusion is present.  No subluxation or dislocation.  IMPRESSION: Cortical defect medially within the medial femoral condyle as described above, possibly subchondral stress fracture or spontaneous osteonecrosis.  This is associated with moderate joint effusion.  Further evaluation with MRI would likely be beneficial.  Original Report Authenticated By: Cyndie Chime, M.D.     1. Effusion of knee joint, left       MDM  X-rays reviewed and report per radiologist.         Barkley Bruns, MD 08/20/11 1356

## 2011-08-20 NOTE — ED Notes (Signed)
Here with left knee pain and swelling that started x 3 weeks ago affecting walking and pressure.pt has hx htn,gout

## 2012-01-19 ENCOUNTER — Other Ambulatory Visit: Payer: Self-pay | Admitting: Family Medicine

## 2012-06-02 IMAGING — CR DG CHEST 2V
3 series · 3 of 3 positions shown · non-contrast
Comparison: 06/21/2008

CLINICAL DATA: Hypertension.Prior smoker.

CHEST - 2 VIEW

[view not recorded (1 of 3)]
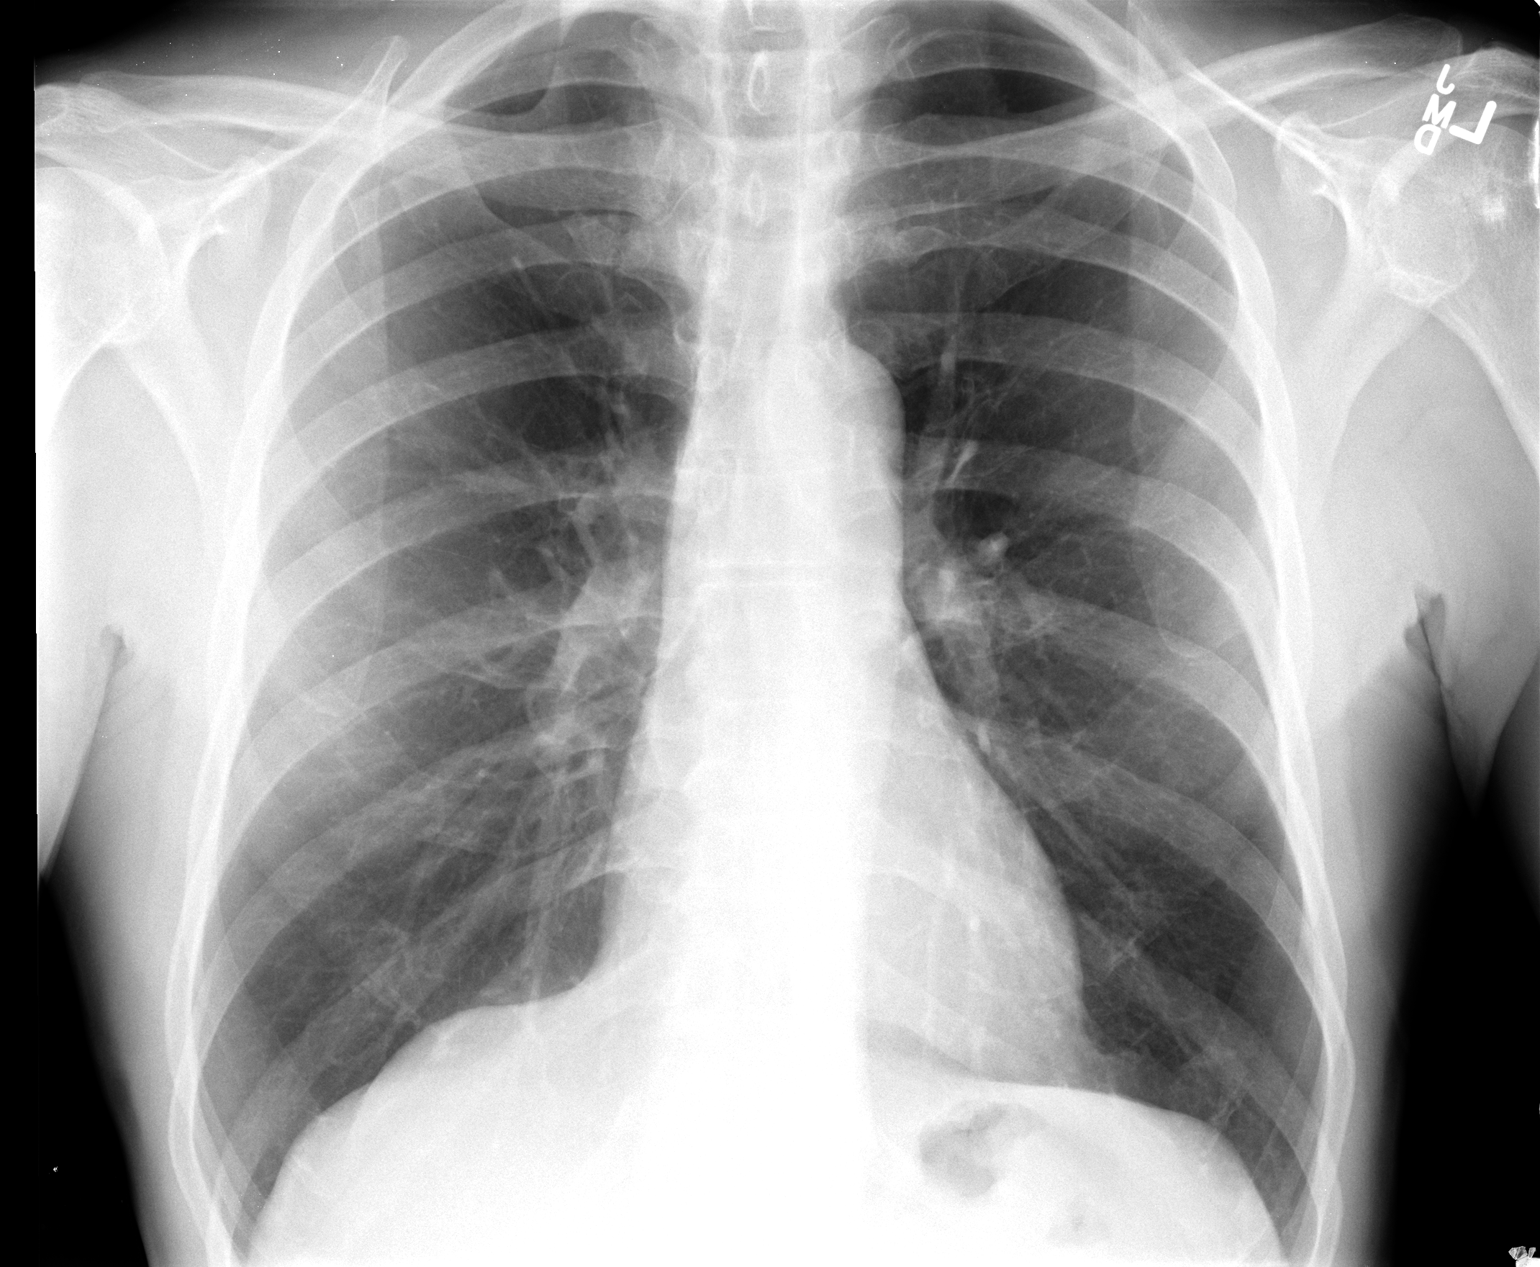

[view not recorded (2 of 3)]
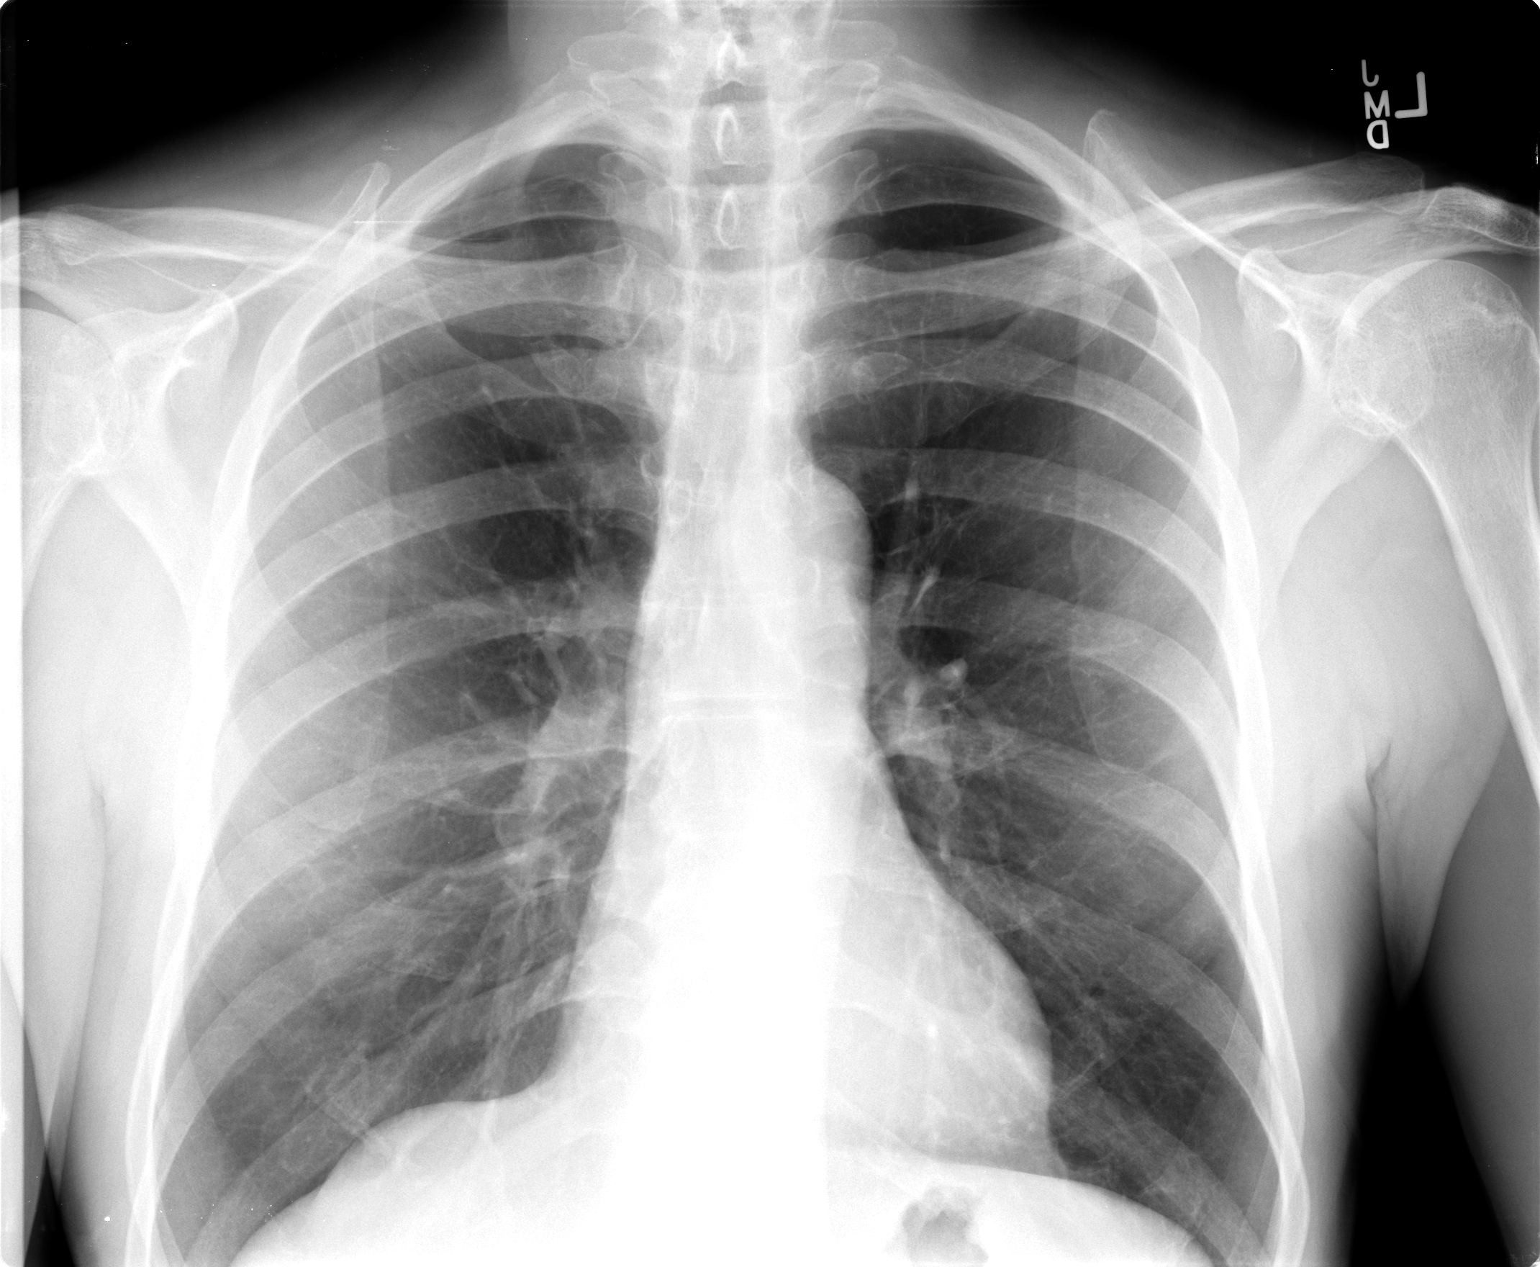

[view not recorded (3 of 3)]
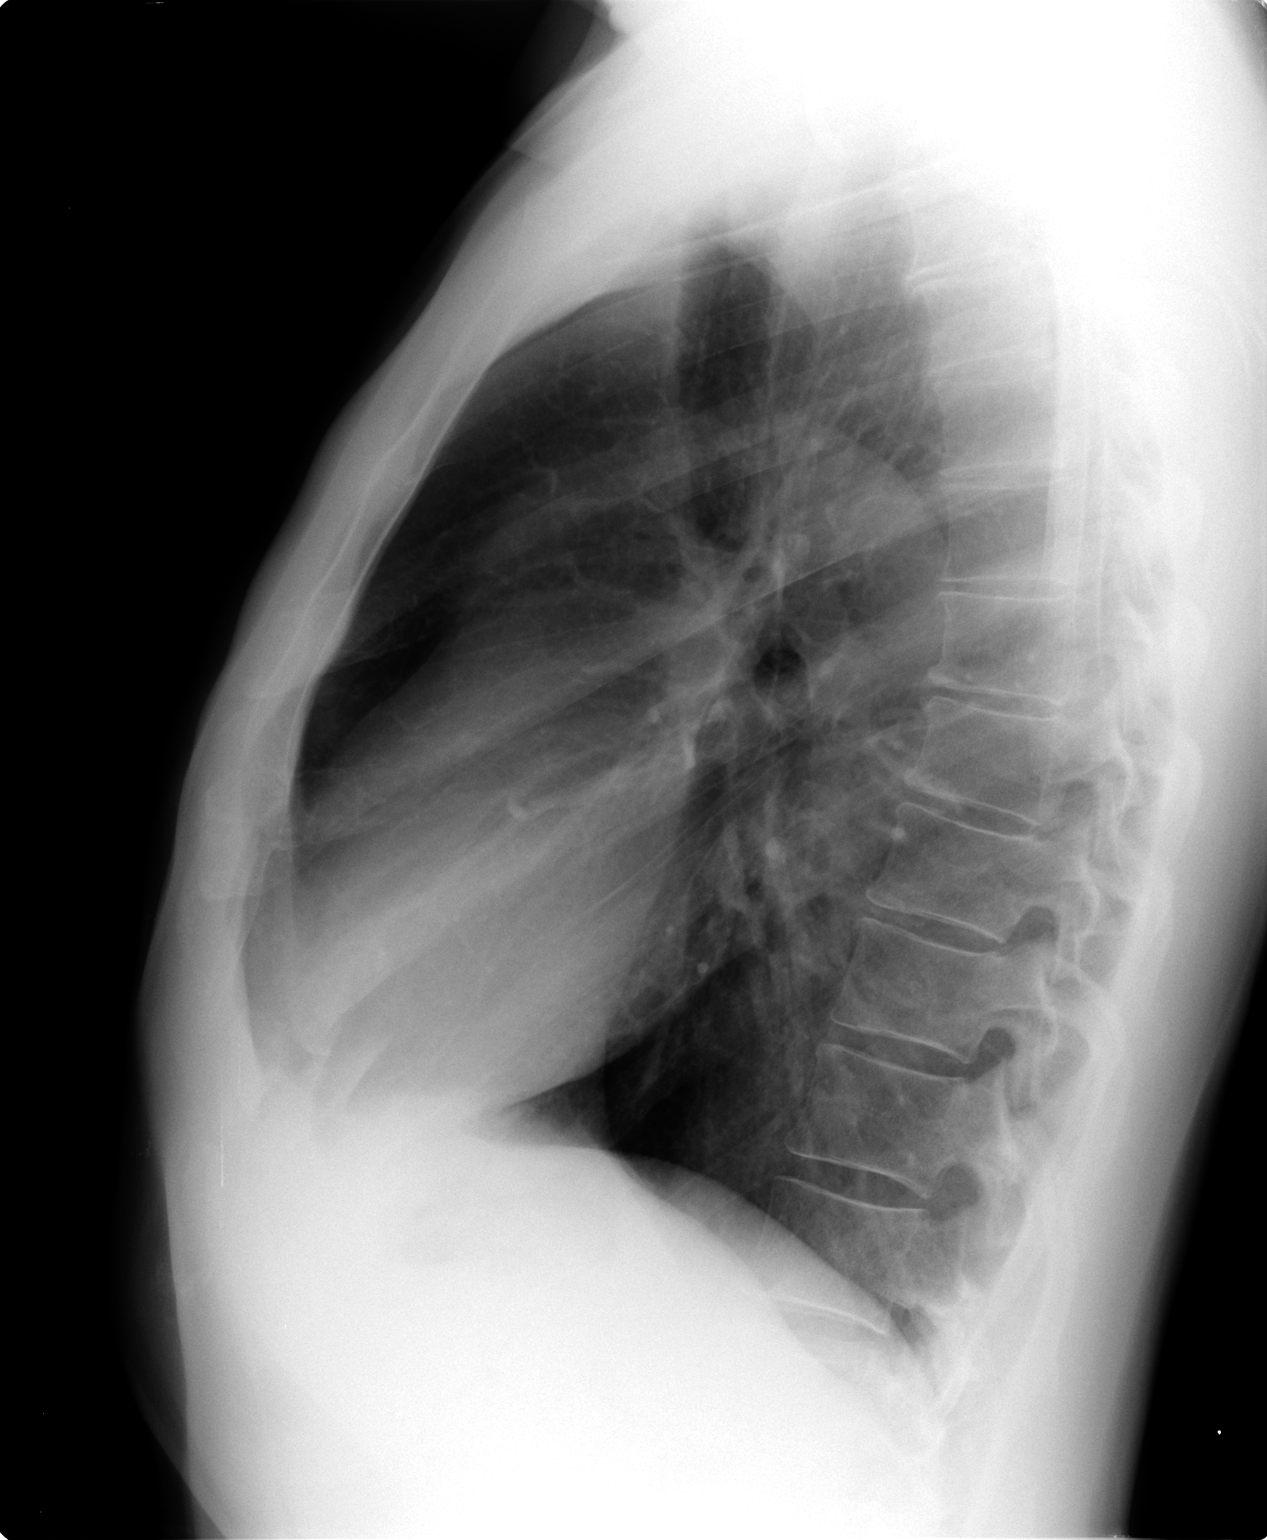

[3 of 3 positions shown; findings below may reference images not displayed]

FINDINGS: Mild hyperinflation of the lungs. Heart and mediastinal
contours are within normal limits.  No focal opacities or
effusions.  No acute bony abnormality.
IMPRESSION: Mild hyperinflation. No active cardiopulmonary disease.

## 2012-06-19 ENCOUNTER — Telehealth: Payer: Self-pay | Admitting: Family Medicine

## 2012-06-19 NOTE — Telephone Encounter (Signed)
No further information needed.  Appt made for 12/17/ 13

## 2012-06-19 NOTE — Telephone Encounter (Signed)
Patient Information:  Caller Name: Jamy  Phone: [phone]  Patient: Leroy Fuller, Leroy Fuller  Gender: Male  DOB: 08/15/1940  Age: 71 Years  PCP: Kelle Darting [facility])  Office Follow Up:  Does the office need to follow up with this patient?: No  Instructions For The Office: N/A  RN Note:  Has a fever but doesn't have a thermometer to check same.  States that this is day 2 with the fever.   Also having swelling in both knees, which is a recurrent problem.  Symptoms  Reason For Call & Symptoms: Calling about a cough x 3 days, same is nonproductive.  Seems to be worsening.  Reviewed Health History In EMR: Yes  Reviewed Medications In EMR: Yes  Reviewed Allergies In EMR: Yes  Reviewed Surgeries / Procedures: Yes  Date of Onset of Symptoms: 06/16/2012  Guideline(s) Used:  Cough  Disposition Per Guideline:   See Today or Tomorrow in Office  Reason For Disposition Reached:   Patient wants to be seen  Advice Given:  N/A  Appointment Scheduled:  06/20/2012 09:00:00 Appointment Scheduled Provider:  Kelle Darting (Family Practice)

## 2012-06-20 ENCOUNTER — Ambulatory Visit (INDEPENDENT_AMBULATORY_CARE_PROVIDER_SITE_OTHER): Payer: Commercial Managed Care - PPO | Admitting: Family Medicine

## 2012-06-20 ENCOUNTER — Encounter: Payer: Self-pay | Admitting: Family Medicine

## 2012-06-20 VITALS — BP 120/80 | Temp 97.8°F | Wt 155.0 lb

## 2012-06-20 DIAGNOSIS — R05 Cough: Secondary | ICD-10-CM

## 2012-06-20 DIAGNOSIS — N410 Acute prostatitis: Secondary | ICD-10-CM | POA: Insufficient documentation

## 2012-06-20 LAB — GLUCOSE, POCT (MANUAL RESULT ENTRY): POC Glucose: 144 mg/dl — AB (ref 70–99)

## 2012-06-20 LAB — POCT URINALYSIS DIPSTICK
Ketones, UA: NEGATIVE
Spec Grav, UA: 1.01
Urobilinogen, UA: 0.2

## 2012-06-20 MED ORDER — CEFTRIAXONE SODIUM 1 G IJ SOLR
1.0000 g | INTRAMUSCULAR | Status: AC
  Administered 2012-06-20: 1 g via INTRAMUSCULAR

## 2012-06-20 MED ORDER — CEFTRIAXONE SODIUM 1 G IJ SOLR: 1.0000 g | INTRAMUSCULAR | Status: DC

## 2012-06-20 MED ORDER — CIPROFLOXACIN HCL 500 MG PO TABS: 500.0000 mg | ORAL_TABLET | Freq: Two times a day (BID) | ORAL | Status: DC

## 2012-06-20 MED ORDER — HYDROCODONE-HOMATROPINE 5-1.5 MG/5ML PO SYRP: 5.0000 mL | ORAL_SOLUTION | Freq: Three times a day (TID) | ORAL | Status: DC | PRN

## 2012-06-20 NOTE — Patient Instructions (Signed)
Rest at home  Drink lots of water  Cipro 1 twice daily  Tylenol when necessary for fever chills  Followup on Thursday sooner if any problems

## 2012-06-20 NOTE — Progress Notes (Signed)
**Note Leroy Fuller-Identified via Obfuscation**   Subjective:    Patient ID: Leroy Fuller, male    DOB: Mar 30, 1941, 71 y.o.   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 Then he developed fever and chills. No vomiting no diarrhea.  No history of previous urinary tract infections in the past   Review of Systems General and urinary tract review of systems otherwise negative he does have a slight cough.    Objective:   Physical Exam  Well-developed thin male no acute distress HEENT negative neck was supple no adenopathy lungs are clear bowel exam negative urinalysis shows evidence of infection with white cells blood and bacteria Blood sugar 140     Assessment & Plan:  Acute prostatitis plan culture urine 1 g of Rocephin IM oral Cipro followup in 48 hours  Viral syndrome Hydromet when necessary

## 2012-06-20 NOTE — Addendum Note (Signed)
Addended by: Kern Reap B on: 06/20/2012 05:17 PM   Modules accepted: Orders

## 2012-06-21 ENCOUNTER — Telehealth: Payer: Self-pay | Admitting: Family Medicine

## 2012-06-21 NOTE — Telephone Encounter (Signed)
Caller: Myrick/Patient; Phone: [phone]; Reason for Call: Nial states he was in the office on 06/20/12 and was advised to stay out of work.  Has another appt in the am.  Employer is requesting a note to be faxed to [phone].  PLEASE F/U WITH PT REGARDING WORK NOTE REQUEST.

## 2012-06-21 NOTE — Telephone Encounter (Signed)
Dr Tawanna Cooler is out of the office this afternoon.  Letter will be given tomorrow at office visit.

## 2012-06-22 ENCOUNTER — Ambulatory Visit (INDEPENDENT_AMBULATORY_CARE_PROVIDER_SITE_OTHER): Payer: Commercial Managed Care - PPO | Admitting: Family Medicine

## 2012-06-22 ENCOUNTER — Encounter: Payer: Self-pay | Admitting: Family Medicine

## 2012-06-22 VITALS — BP 110/70 | Temp 99.6°F | Wt 155.0 lb

## 2012-06-22 DIAGNOSIS — N410 Acute prostatitis: Secondary | ICD-10-CM

## 2012-06-22 LAB — POCT URINALYSIS DIPSTICK
Bilirubin, UA: NEGATIVE
Leukocytes, UA: NEGATIVE
Nitrite, UA: NEGATIVE
pH, UA: 5.5

## 2012-06-22 NOTE — Patient Instructions (Signed)
Continue to drink lots of water  Take the Septra one twice daily till bilateral empty  Return when necessary  Okay to go back to work December 23

## 2012-06-22 NOTE — Progress Notes (Signed)
  Subjective:    Patient ID: Leroy Fuller, male    DOB: 11/19/1940, 71 y.o.   MRN: [id number]  HPI Leroy Fuller is a 71 year old male single nonsmoker who comes in today for followup of acute prostatitis  We saw him 48 hours ago and gave him 1 g of Rocephin and oral Cipro 500 twice a day. He comes back today for followup. Afebrile feeling better repeat urinalysis is clear he   Review of Systems    review of systems otherwise negative Objective:   Physical Exam Well developed well nourished male no acute distress urinalysis approaching normal       Assessment & Plan:  Acute prostatitis resolving plan finish Cipro return when necessary out of work until December 23

## 2012-06-24 LAB — URINE CULTURE

## 2012-07-10 ENCOUNTER — Encounter: Payer: Self-pay | Admitting: *Deleted

## 2012-11-28 ENCOUNTER — Other Ambulatory Visit: Payer: Self-pay | Admitting: Family Medicine

## 2012-12-10 ENCOUNTER — Other Ambulatory Visit: Payer: Self-pay | Admitting: Family Medicine

## 2013-01-30 ENCOUNTER — Other Ambulatory Visit: Payer: Commercial Managed Care - PPO

## 2013-02-06 ENCOUNTER — Encounter: Payer: Commercial Managed Care - PPO | Admitting: Family Medicine

## 2013-02-28 ENCOUNTER — Other Ambulatory Visit: Payer: Self-pay | Admitting: Family Medicine

## 2013-04-02 ENCOUNTER — Other Ambulatory Visit (INDEPENDENT_AMBULATORY_CARE_PROVIDER_SITE_OTHER): Payer: Commercial Managed Care - PPO

## 2013-04-02 DIAGNOSIS — Z0000 Encounter for general adult medical examination without abnormal findings: Secondary | ICD-10-CM

## 2013-04-02 LAB — LIPID PANEL
HDL: 60.8 mg/dL (ref >39.00)
LDL Cholesterol: 96 mg/dL (ref 0–99)
Total CHOL/HDL Ratio: 3
Triglycerides: 87 mg/dL (ref 0.0–149.0)
VLDL: 17.4 mg/dL (ref 0.0–40.0)

## 2013-04-02 LAB — POCT URINALYSIS DIPSTICK
Bilirubin, UA: NEGATIVE
Blood, UA: NEGATIVE
Ketones, UA: NEGATIVE
Leukocytes, UA: NEGATIVE
Nitrite, UA: NEGATIVE
pH, UA: 5.5

## 2013-04-02 LAB — CBC WITH DIFFERENTIAL/PLATELET
Basophils Relative: 0.8 % (ref 0.0–3.0)
Eosinophils Relative: 3 % (ref 0.0–5.0)
Hemoglobin: 13.6 g/dL (ref 13.0–17.0)
Lymphocytes Relative: 35.3 % (ref 12.0–46.0)
Monocytes Relative: 12.2 % — ABNORMAL HIGH (ref 3.0–12.0)
Neutrophils Relative %: 48.7 % (ref 43.0–77.0)
RBC: 4.11 Mil/uL — ABNORMAL LOW (ref 4.22–5.81)
WBC: 4 10*3/uL — ABNORMAL LOW (ref 4.5–10.5)

## 2013-04-02 LAB — HEPATIC FUNCTION PANEL
ALT: 20 U/L (ref 0–53)
Albumin: 3.8 g/dL (ref 3.5–5.2)
Alkaline Phosphatase: 62 U/L (ref 39–117)
Bilirubin, Direct: 0.2 mg/dL (ref 0.0–0.3)
Total Protein: 7.3 g/dL (ref 6.0–8.3)

## 2013-04-02 LAB — BASIC METABOLIC PANEL
Calcium: 9 mg/dL (ref 8.4–10.5)
Creatinine, Ser: 1.1 mg/dL (ref 0.4–1.5)
Sodium: 136 mEq/L (ref 135–145)

## 2013-04-02 LAB — PSA: PSA: 1.17 ng/mL (ref 0.10–4.00)

## 2013-04-05 ENCOUNTER — Encounter: Payer: Commercial Managed Care - PPO | Admitting: Family Medicine

## 2013-04-09 ENCOUNTER — Encounter: Payer: Self-pay | Admitting: Family Medicine

## 2013-04-09 ENCOUNTER — Ambulatory Visit (INDEPENDENT_AMBULATORY_CARE_PROVIDER_SITE_OTHER): Payer: Commercial Managed Care - PPO | Admitting: Family Medicine

## 2013-04-09 VITALS — BP 120/70 | Temp 97.9°F | Ht 72.5 in | Wt 161.0 lb

## 2013-04-09 DIAGNOSIS — Z23 Encounter for immunization: Secondary | ICD-10-CM

## 2013-04-09 DIAGNOSIS — I10 Essential (primary) hypertension: Secondary | ICD-10-CM

## 2013-04-09 DIAGNOSIS — M109 Gout, unspecified: Secondary | ICD-10-CM

## 2013-04-09 DIAGNOSIS — D649 Anemia, unspecified: Secondary | ICD-10-CM

## 2013-04-09 MED ORDER — ALLOPURINOL 300 MG PO TABS: ORAL_TABLET | ORAL | Status: DC

## 2013-04-09 MED ORDER — ATENOLOL-CHLORTHALIDONE 50-25 MG PO TABS: ORAL_TABLET | ORAL | Status: DC

## 2013-04-09 NOTE — Patient Instructions (Signed)
Call Dr. Gweneth Dimitri for evaluation of your blurred vision  Continue current medications  Call your insurance company and find out where you can get the shingles vaccine the cheapest  If this year call and leave a voicemail with Fleet Contras and she will ordered for you.  Return in one year sooner if any problems

## 2013-04-09 NOTE — Progress Notes (Signed)
  Subjective:    Patient ID: Leroy Fuller, male    DOB: 1941/02/24, 72 y.o.   MRN: [id number]  HPI Leroy Fuller is a 72 year old single male nonsmoker still works [place] the [place] who comes in today for a Medicare wellness examination  He takes allopurinol 300 mg daily to prevent gout  He takes Tenoretic 50-25 dose one half tab daily BP 120/70  He does not get routine eye care and he says blurred vision. 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 His weight is 161 pounds he is thin he exercises by hard labor   Review of Systems  Constitutional: Negative.   HENT: Negative.   Eyes: Negative.   Respiratory: Negative.   Cardiovascular: Negative.   Gastrointestinal: Negative.   Endocrine: Negative.   Genitourinary: Negative.   Musculoskeletal: Negative.   Skin: Negative.   Allergic/Immunologic: Negative.   Neurological: Negative.   Hematological: Negative.   Psychiatric/Behavioral: Negative.        Objective:   Physical Exam  Nursing note and vitals reviewed. Constitutional: He is oriented to person, place, and time. He appears well-developed and well-nourished.  HENT:  Head: Normocephalic and atraumatic.  Right Ear: External ear normal.  Left Ear: External ear normal.  Nose: Nose normal.  Mouth/Throat: Oropharynx is clear and moist.  Eyes: Conjunctivae and EOM are normal. Pupils are equal, round, and reactive to light.  Neck: Normal range of motion. Neck supple. No JVD present. No tracheal deviation present. No thyromegaly present.  Cardiovascular: Normal rate, regular rhythm, normal heart sounds and intact distal pulses.  Exam reveals no gallop and no friction rub.   No  murmur heard. Pulmonary/Chest: Effort normal and breath sounds normal. No stridor. No respiratory distress. He has no wheezes. He has no rales. He exhibits no tenderness.  Abdominal: Soft. Bowel sounds are normal. He exhibits no distension and no mass. There is no tenderness. There is no rebound and no guarding.  Genitourinary: Rectum normal, prostate normal and penis normal. Guaiac negative stool. No penile tenderness.  Musculoskeletal: Normal range of motion. He exhibits no edema and no tenderness.  Lymphadenopathy:    He has no cervical adenopathy.  Neurological: He is alert and oriented to person, place, and time. He has normal reflexes. No cranial nerve deficit. He exhibits normal muscle tone.  Skin: Skin is warm and dry. No rash noted. No erythema. No pallor.  Psychiatric: He has a normal mood and affect. His behavior is normal. Judgment and thought content normal.          Assessment & Plan:  Healthy male  Bilateral cataracts referred to Dr. Vonna Kotyk  History of gout continue allopurinol  Hypertension continue Tenoretic

## 2014-01-06 ENCOUNTER — Other Ambulatory Visit: Payer: Self-pay | Admitting: Family Medicine

## 2014-03-13 ENCOUNTER — Other Ambulatory Visit: Payer: Self-pay | Admitting: Family Medicine

## 2014-03-14 ENCOUNTER — Encounter: Payer: Self-pay | Admitting: Internal Medicine

## 2014-04-13 ENCOUNTER — Other Ambulatory Visit: Payer: Self-pay | Admitting: Family Medicine

## 2014-05-06 ENCOUNTER — Other Ambulatory Visit (INDEPENDENT_AMBULATORY_CARE_PROVIDER_SITE_OTHER): Payer: Commercial Managed Care - PPO

## 2014-05-06 DIAGNOSIS — Z0000 Encounter for general adult medical examination without abnormal findings: Secondary | ICD-10-CM

## 2014-05-06 LAB — CBC WITH DIFFERENTIAL/PLATELET
BASOS ABS: 0 10*3/uL (ref 0.0–0.1)
Basophils Relative: 0.6 % (ref 0.0–3.0)
EOS ABS: 0.2 10*3/uL (ref 0.0–0.7)
Eosinophils Relative: 2.9 % (ref 0.0–5.0)
HEMATOCRIT: 41.8 % (ref 39.0–52.0)
Hemoglobin: 14 g/dL (ref 13.0–17.0)
LYMPHS ABS: 1.6 10*3/uL (ref 0.7–4.0)
Lymphocytes Relative: 26.8 % (ref 12.0–46.0)
MCHC: 33.5 g/dL (ref 30.0–36.0)
MCV: 99 fl (ref 78.0–100.0)
MONOS PCT: 11.9 % (ref 3.0–12.0)
Monocytes Absolute: 0.7 10*3/uL (ref 0.1–1.0)
Neutro Abs: 3.3 10*3/uL (ref 1.4–7.7)
Neutrophils Relative %: 57.8 % (ref 43.0–77.0)
PLATELETS: 244 10*3/uL (ref 150.0–400.0)
RBC: 4.22 Mil/uL (ref 4.22–5.81)
RDW: 13.1 % (ref 11.5–15.5)
WBC: 5.8 10*3/uL (ref 4.0–10.5)

## 2014-05-06 LAB — BASIC METABOLIC PANEL
BUN: 12 mg/dL (ref 6–23)
CO2: 27 mEq/L (ref 19–32)
Calcium: 9.1 mg/dL (ref 8.4–10.5)
Chloride: 97 mEq/L (ref 96–112)
Creatinine, Ser: 1.1 mg/dL (ref 0.4–1.5)
GFR: 89.42 mL/min (ref >60.00)
GLUCOSE: 106 mg/dL — AB (ref 70–99)
Potassium: 3.8 mEq/L (ref 3.5–5.1)
Sodium: 135 mEq/L (ref 135–145)

## 2014-05-06 LAB — TSH: TSH: 2.34 u[IU]/mL (ref 0.35–4.50)

## 2014-05-06 LAB — LIPID PANEL
CHOLESTEROL: 186 mg/dL (ref 0–200)
HDL: 62.1 mg/dL (ref >39.00)
LDL Cholesterol: 109 mg/dL — ABNORMAL HIGH (ref 0–99)
NonHDL: 123.9
Total CHOL/HDL Ratio: 3
Triglycerides: 74 mg/dL (ref 0.0–149.0)
VLDL: 14.8 mg/dL (ref 0.0–40.0)

## 2014-05-06 LAB — POCT URINALYSIS DIPSTICK
Bilirubin, UA: NEGATIVE
Blood, UA: NEGATIVE
Glucose, UA: NEGATIVE
Ketones, UA: NEGATIVE
NITRITE UA: NEGATIVE
PH UA: 7.5
Spec Grav, UA: 1.01
UROBILINOGEN UA: 2

## 2014-05-06 LAB — PSA: PSA: 2.05 ng/mL (ref 0.10–4.00)

## 2014-05-06 LAB — HEPATIC FUNCTION PANEL
ALT: 16 U/L (ref 0–53)
AST: 26 U/L (ref 0–37)
Albumin: 3.5 g/dL (ref 3.5–5.2)
Alkaline Phosphatase: 66 U/L (ref 39–117)
BILIRUBIN TOTAL: 1.6 mg/dL — AB (ref 0.2–1.2)
Bilirubin, Direct: 0.3 mg/dL (ref 0.0–0.3)
Total Protein: 7.7 g/dL (ref 6.0–8.3)

## 2014-05-14 ENCOUNTER — Other Ambulatory Visit: Payer: Self-pay | Admitting: Family Medicine

## 2014-05-16 ENCOUNTER — Ambulatory Visit (INDEPENDENT_AMBULATORY_CARE_PROVIDER_SITE_OTHER): Payer: Commercial Managed Care - PPO | Admitting: Family Medicine

## 2014-05-16 ENCOUNTER — Encounter: Payer: Self-pay | Admitting: Family Medicine

## 2014-05-16 VITALS — BP 120/80 | Temp 97.8°F | Ht 72.5 in | Wt 164.0 lb

## 2014-05-16 DIAGNOSIS — D509 Iron deficiency anemia, unspecified: Secondary | ICD-10-CM

## 2014-05-16 DIAGNOSIS — I10 Essential (primary) hypertension: Secondary | ICD-10-CM

## 2014-05-16 DIAGNOSIS — Z23 Encounter for immunization: Secondary | ICD-10-CM

## 2014-05-16 DIAGNOSIS — M10012 Idiopathic gout, left shoulder: Secondary | ICD-10-CM

## 2014-05-16 MED ORDER — ALLOPURINOL 300 MG PO TABS: ORAL_TABLET | ORAL | Status: DC

## 2014-05-16 MED ORDER — ATENOLOL-CHLORTHALIDONE 50-25 MG PO TABS: ORAL_TABLET | ORAL | Status: DC

## 2014-05-16 NOTE — Progress Notes (Signed)
   Subjective:    Patient ID: Leroy Fuller, male    DOB: 05-06-1941, 73 y.o.   MRN: [id number]  HPI Leroy Fuller is a 73 year old single male nonsmoker who comes in today for evaluation of hypertension and gout  He takes Tenoretic 50- 25 mg,,,,,,, one half tab daily BP 120/88  He takes allopurinol 300 mg daily to prevent gout  He does not get routine eye care...Marland KitchenMarland KitchenMarland Kitchen Referred to Dr. Bing Plume......Marland Kitchen Does get regular dental care..... 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 Home self safety reviewed no issues identified, no guns in the house, he does have a healthcare power of attorney and living well  Vaccinations updated by Apolonio Schneiders   Review of Systems  Constitutional: Negative.   HENT: Negative.   Eyes: Negative.   Respiratory: Negative.   Cardiovascular: Negative.   Gastrointestinal: Negative.   Endocrine: Negative.   Genitourinary: Negative.   Musculoskeletal: Negative.   Skin: Negative.   Allergic/Immunologic: Negative.   Neurological: Negative.   Hematological: Negative.   Psychiatric/Behavioral: Negative.        Objective:   Physical Exam  Constitutional: He is oriented to person, place, and time. He appears well-developed and well-nourished.  HENT:  Head: Normocephalic and atraumatic.  Right Ear: External ear normal.  Left Ear: External ear normal.  Nose: Nose normal.  Mouth/Throat: Oropharynx is clear and moist.  Eyes: Conjunctivae and EOM are normal. Pupils are equal, round, and reactive to light.  Neck: Normal range of motion. Neck supple. No JVD present. No tracheal deviation present. No thyromegaly present.  Cardiovascular: Normal rate, regular rhythm, normal heart sounds and intact distal pulses.  Exam reveals no gallop and no friction rub.   No murmur heard. No carotid nor aortic bruits peripheral pulses 2+ and symmetrical    Pulmonary/Chest: Effort normal and breath sounds normal. No stridor. No respiratory distress. He has no wheezes. He has no rales. He exhibits no tenderness.  Abdominal: Soft. Bowel sounds are normal. He exhibits no distension and no mass. There is no tenderness. There is no rebound and no guarding.  Genitourinary: Rectum normal and penis normal. Guaiac negative stool. No penile tenderness.  2+ symmetrical nonnodular BPH......Marland Kitchen  Musculoskeletal: Normal range of motion. He exhibits no edema or tenderness.  Lymphadenopathy:    He has no cervical adenopathy.  Neurological: He is alert and oriented to person, place, and time. He has normal reflexes. No cranial nerve deficit. He exhibits normal muscle tone.  Skin: Skin is warm and dry. No rash noted. No erythema. No pallor.  Psychiatric: He has a normal mood and affect. His behavior is normal. Judgment and thought content normal.  Nursing note and vitals reviewed.  Bilateral cataracts       Assessment & Plan:  Healthy male  Hypertension ago continue current therapy  History of gout continue allopurinol  No eye exams recently,,,,,,,,, bilateral cataracts,, referred to Dr. Bing Plume

## 2014-05-16 NOTE — Patient Instructions (Signed)
Continue your current medications  Follow-up in 1 year sooner if any problem

## 2014-05-16 NOTE — Progress Notes (Signed)
Pre visit review using our clinic review tool, if applicable. No additional management support is needed unless otherwise documented below in the visit note. 

## 2014-06-19 ENCOUNTER — Other Ambulatory Visit: Payer: Self-pay | Admitting: Family Medicine

## 2014-10-08 ENCOUNTER — Encounter: Payer: Self-pay | Admitting: Internal Medicine

## 2015-03-05 ENCOUNTER — Telehealth: Payer: Self-pay | Admitting: Family Medicine

## 2015-03-05 NOTE — Telephone Encounter (Signed)
Okay to work in

## 2015-03-05 NOTE — Telephone Encounter (Signed)
Pt would like a cpx before end of yr. Pt last cpx in nov 2015. Can I create 30 min slot?

## 2015-03-06 NOTE — Telephone Encounter (Signed)
Pt ans after several rings and no vm

## 2015-03-07 NOTE — Telephone Encounter (Signed)
No ans after several rings °

## 2015-03-14 NOTE — Telephone Encounter (Signed)
No ans after several rings no vm

## 2015-04-03 ENCOUNTER — Encounter: Payer: Self-pay | Admitting: Internal Medicine

## 2015-05-31 ENCOUNTER — Other Ambulatory Visit: Payer: Self-pay | Admitting: Family Medicine

## 2015-06-20 ENCOUNTER — Telehealth: Payer: Self-pay | Admitting: Family Medicine

## 2015-06-20 MED ORDER — ALLOPURINOL 300 MG PO TABS
300.0000 mg | ORAL_TABLET | Freq: Every day | ORAL | Status: DC
Start: 2015-06-20 — End: 2015-11-11

## 2015-06-20 MED ORDER — ATENOLOL-CHLORTHALIDONE 50-25 MG PO TABS: 0.5000 | ORAL_TABLET | Freq: Every day | ORAL | Status: DC

## 2015-06-20 NOTE — Telephone Encounter (Signed)
Leroy Fuller called saying he needs refills for Atenolol and Allopurinol. He's scheduled for a CPE with Dr. Sherren Mocha in May but will run out of meds before then. Please give him a call if you have questions or concerns.  Pt's ph# [phone] and [phone] ext [phone] (work) Thank you.

## 2015-06-20 NOTE — Telephone Encounter (Signed)
Refill sent.

## 2015-06-30 ENCOUNTER — Other Ambulatory Visit: Payer: Self-pay | Admitting: Family Medicine

## 2015-07-01 ENCOUNTER — Other Ambulatory Visit: Payer: Self-pay | Admitting: Family Medicine

## 2015-10-02 ENCOUNTER — Other Ambulatory Visit: Payer: Self-pay | Admitting: Family Medicine

## 2015-11-03 ENCOUNTER — Other Ambulatory Visit (INDEPENDENT_AMBULATORY_CARE_PROVIDER_SITE_OTHER): Payer: Commercial Managed Care - PPO

## 2015-11-03 DIAGNOSIS — Z0000 Encounter for general adult medical examination without abnormal findings: Secondary | ICD-10-CM | POA: Diagnosis not present

## 2015-11-03 LAB — BASIC METABOLIC PANEL
BUN: 15 mg/dL (ref 6–23)
CALCIUM: 9.1 mg/dL (ref 8.4–10.5)
CO2: 32 mEq/L (ref 19–32)
CREATININE: 1.05 mg/dL (ref 0.40–1.50)
Chloride: 99 mEq/L (ref 96–112)
GFR: 89.04 mL/min (ref >60.00)
Glucose, Bld: 104 mg/dL — ABNORMAL HIGH (ref 70–99)
Potassium: 3.5 mEq/L (ref 3.5–5.1)
SODIUM: 138 meq/L (ref 135–145)

## 2015-11-03 LAB — LIPID PANEL
CHOLESTEROL: 155 mg/dL (ref 0–200)
HDL: 53.9 mg/dL (ref >39.00)
LDL Cholesterol: 82 mg/dL (ref 0–99)
NonHDL: 101.12
Total CHOL/HDL Ratio: 3
Triglycerides: 94 mg/dL (ref 0.0–149.0)
VLDL: 18.8 mg/dL (ref 0.0–40.0)

## 2015-11-03 LAB — POC URINALSYSI DIPSTICK (AUTOMATED)
Bilirubin, UA: NEGATIVE
Glucose, UA: NEGATIVE
Ketones, UA: NEGATIVE
Leukocytes, UA: NEGATIVE
Nitrite, UA: NEGATIVE
PH UA: 7
PROTEIN UA: NEGATIVE
RBC UA: NEGATIVE
SPEC GRAV UA: 1.015
UROBILINOGEN UA: 4

## 2015-11-03 LAB — CBC WITH DIFFERENTIAL/PLATELET
BASOS ABS: 0 10*3/uL (ref 0.0–0.1)
Basophils Relative: 0.7 % (ref 0.0–3.0)
Eosinophils Absolute: 0.1 10*3/uL (ref 0.0–0.7)
Eosinophils Relative: 2 % (ref 0.0–5.0)
HCT: 39.4 % (ref 39.0–52.0)
Hemoglobin: 13.4 g/dL (ref 13.0–17.0)
LYMPHS ABS: 1.2 10*3/uL (ref 0.7–4.0)
Lymphocytes Relative: 31.4 % (ref 12.0–46.0)
MCHC: 33.9 g/dL (ref 30.0–36.0)
MCV: 98.5 fl (ref 78.0–100.0)
MONOS PCT: 14.3 % — AB (ref 3.0–12.0)
Monocytes Absolute: 0.6 10*3/uL (ref 0.1–1.0)
NEUTROS ABS: 2 10*3/uL (ref 1.4–7.7)
NEUTROS PCT: 51.6 % (ref 43.0–77.0)
PLATELETS: 200 10*3/uL (ref 150.0–400.0)
RBC: 4 Mil/uL — ABNORMAL LOW (ref 4.22–5.81)
RDW: 13.4 % (ref 11.5–15.5)
WBC: 3.9 10*3/uL — ABNORMAL LOW (ref 4.0–10.5)

## 2015-11-03 LAB — HEPATIC FUNCTION PANEL
ALBUMIN: 3.9 g/dL (ref 3.5–5.2)
ALK PHOS: 56 U/L (ref 39–117)
ALT: 12 U/L (ref 0–53)
AST: 20 U/L (ref 0–37)
Bilirubin, Direct: 0.4 mg/dL — ABNORMAL HIGH (ref 0.0–0.3)
Total Bilirubin: 1.8 mg/dL — ABNORMAL HIGH (ref 0.2–1.2)
Total Protein: 7.1 g/dL (ref 6.0–8.3)

## 2015-11-03 LAB — PSA: PSA: 1.1 ng/mL (ref 0.10–4.00)

## 2015-11-03 LAB — TSH: TSH: 1.1 u[IU]/mL (ref 0.35–4.50)

## 2015-11-04 ENCOUNTER — Other Ambulatory Visit: Payer: Commercial Managed Care - PPO

## 2015-11-11 ENCOUNTER — Ambulatory Visit (INDEPENDENT_AMBULATORY_CARE_PROVIDER_SITE_OTHER): Payer: Commercial Managed Care - PPO | Admitting: Family Medicine

## 2015-11-11 ENCOUNTER — Encounter: Payer: Self-pay | Admitting: Family Medicine

## 2015-11-11 ENCOUNTER — Encounter: Payer: Commercial Managed Care - PPO | Admitting: Family Medicine

## 2015-11-11 VITALS — BP 120/80 | Temp 97.5°F | Ht 73.0 in | Wt 160.0 lb

## 2015-11-11 DIAGNOSIS — M10012 Idiopathic gout, left shoulder: Secondary | ICD-10-CM | POA: Diagnosis not present

## 2015-11-11 DIAGNOSIS — I10 Essential (primary) hypertension: Secondary | ICD-10-CM | POA: Diagnosis not present

## 2015-11-11 DIAGNOSIS — Z0000 Encounter for general adult medical examination without abnormal findings: Secondary | ICD-10-CM

## 2015-11-11 MED ORDER — ALLOPURINOL 300 MG PO TABS: ORAL_TABLET | ORAL | Status: DC

## 2015-11-11 MED ORDER — ATENOLOL-CHLORTHALIDONE 50-25 MG PO TABS
0.5000 | ORAL_TABLET | Freq: Every day | ORAL | Status: DC
Start: 2015-11-11 — End: 2016-11-12

## 2015-11-11 NOTE — Patient Instructions (Signed)
Continue current medications  Follow-up in one year sooner if any problems  Call in November for your physical examination in May............... Tommi Rumps or Almyra Free are 2 new adult nurse practitioner's or Dr. Martinique

## 2015-11-11 NOTE — Progress Notes (Signed)
   Subjective:    Patient ID: Leroy Fuller, male    DOB: 1941-02-14, 75 y.o.   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 On his medication he's asymptomatic  He does not get routine eye care.... Refer to Dr. Girtha Rm....... does get regular dental care. Last colonoscopy was 2005......... 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   HENT: Negative.   Eyes: Negative.   Respiratory: Negative.   Cardiovascular: Negative.   Gastrointestinal: Negative.   Endocrine: Negative.   Genitourinary: Negative.   Musculoskeletal: Negative.   Skin: Negative.   Allergic/Immunologic: Negative.   Neurological: Negative.   Hematological: Negative.   Psychiatric/Behavioral: Negative.        Objective:   Physical Exam  Constitutional: He is oriented to person, place, and time. He appears well-developed and well-nourished.  HENT:  Head: Normocephalic and atraumatic.  Right Ear: External ear normal.  Left Ear: External ear normal.  Nose: Nose normal.  Mouth/Throat: Oropharynx is clear and moist.  Eyes: Conjunctivae and EOM are normal. Pupils are equal, round, and reactive to light.  Neck: Normal range of motion. Neck supple. No JVD present. No tracheal deviation present. No thyromegaly present.  Cardiovascular: Normal rate, regular rhythm, normal heart sounds and intact distal pulses.  Exam reveals no gallop and no  friction rub.   No murmur heard. No carotid nor aortic bruits peripheral pulses 1+ and symmetrical  Pulmonary/Chest: Effort normal and breath sounds normal. No stridor. No respiratory distress. He has no wheezes. He has no rales. He exhibits no tenderness.  Abdominal: Soft. Bowel sounds are normal. He exhibits no distension and no mass. There is no tenderness. There is no rebound and no guarding.  Genitourinary: Rectum normal and penis normal. Guaiac negative stool. No penile tenderness.  2+ symmetrical nonnodular BPH  Musculoskeletal: Normal range of motion. He exhibits no edema or tenderness.  Lymphadenopathy:    He has no cervical adenopathy.  Neurological: He is alert and oriented to person, place, and time. He has normal reflexes. No cranial nerve deficit. He exhibits normal muscle tone.  Skin: Skin is warm and dry. No rash noted. No erythema. No pallor.  Psychiatric: He has a normal mood and affect. His behavior is normal. Judgment and thought content normal.  Nursing note and vitals reviewed.         Assessment & Plan:Healthy male  Hypertension ago........... continue current therapy  Gout ............. asymptomatic on allopurinol .... Continue allopurinol daily

## 2015-11-11 NOTE — Progress Notes (Signed)
Pre visit review using our clinic review tool, if applicable. No additional management support is needed unless otherwise documented below in the visit note. 

## 2016-11-12 ENCOUNTER — Ambulatory Visit (INDEPENDENT_AMBULATORY_CARE_PROVIDER_SITE_OTHER)
Admission: RE | Admit: 2016-11-12 | Discharge: 2016-11-12 | Disposition: A | Discharge status: Home / Self Care | Payer: Commercial Managed Care - PPO | Source: Ambulatory Visit | Attending: Adult Health | Admitting: Adult Health

## 2016-11-12 ENCOUNTER — Encounter: Payer: Self-pay | Admitting: Adult Health

## 2016-11-12 ENCOUNTER — Ambulatory Visit (INDEPENDENT_AMBULATORY_CARE_PROVIDER_SITE_OTHER): Payer: Commercial Managed Care - PPO | Admitting: Adult Health

## 2016-11-12 VITALS — BP 124/68 | Temp 98.1°F | Ht 73.0 in | Wt 160.5 lb

## 2016-11-12 DIAGNOSIS — Z760 Encounter for issue of repeat prescription: Secondary | ICD-10-CM

## 2016-11-12 DIAGNOSIS — M25562 Pain in left knee: Secondary | ICD-10-CM | POA: Diagnosis not present

## 2016-11-12 DIAGNOSIS — I10 Essential (primary) hypertension: Secondary | ICD-10-CM | POA: Diagnosis not present

## 2016-11-12 DIAGNOSIS — Z1211 Encounter for screening for malignant neoplasm of colon: Secondary | ICD-10-CM

## 2016-11-12 DIAGNOSIS — Z0000 Encounter for general adult medical examination without abnormal findings: Secondary | ICD-10-CM | POA: Diagnosis not present

## 2016-11-12 DIAGNOSIS — G8929 Other chronic pain: Secondary | ICD-10-CM | POA: Diagnosis not present

## 2016-11-12 LAB — CBC WITH DIFFERENTIAL/PLATELET
Basophils Absolute: 0 10*3/uL (ref 0.0–0.1)
Basophils Relative: 0.9 % (ref 0.0–3.0)
EOS PCT: 2.8 % (ref 0.0–5.0)
Eosinophils Absolute: 0.1 10*3/uL (ref 0.0–0.7)
HEMATOCRIT: 40.5 % (ref 39.0–52.0)
Hemoglobin: 13.6 g/dL (ref 13.0–17.0)
LYMPHS ABS: 1.3 10*3/uL (ref 0.7–4.0)
LYMPHS PCT: 27.9 % (ref 12.0–46.0)
MCHC: 33.7 g/dL (ref 30.0–36.0)
MCV: 100.1 fl — AB (ref 78.0–100.0)
MONOS PCT: 15 % — AB (ref 3.0–12.0)
Monocytes Absolute: 0.7 10*3/uL (ref 0.1–1.0)
NEUTROS ABS: 2.4 10*3/uL (ref 1.4–7.7)
NEUTROS PCT: 53.4 % (ref 43.0–77.0)
PLATELETS: 274 10*3/uL (ref 150.0–400.0)
RBC: 4.05 Mil/uL — ABNORMAL LOW (ref 4.22–5.81)
RDW: 13.3 % (ref 11.5–15.5)
WBC: 4.6 10*3/uL (ref 4.0–10.5)

## 2016-11-12 LAB — BASIC METABOLIC PANEL
BUN: 18 mg/dL (ref 6–23)
CALCIUM: 9.4 mg/dL (ref 8.4–10.5)
CO2: 32 meq/L (ref 19–32)
CREATININE: 1.07 mg/dL (ref 0.40–1.50)
Chloride: 100 mEq/L (ref 96–112)
GFR: 86.88 mL/min (ref >60.00)
Glucose, Bld: 104 mg/dL — ABNORMAL HIGH (ref 70–99)
Potassium: 4.5 mEq/L (ref 3.5–5.1)
Sodium: 138 mEq/L (ref 135–145)

## 2016-11-12 LAB — HEPATIC FUNCTION PANEL
ALBUMIN: 4.1 g/dL (ref 3.5–5.2)
ALK PHOS: 65 U/L (ref 39–117)
ALT: 17 U/L (ref 0–53)
AST: 26 U/L (ref 0–37)
Bilirubin, Direct: 0.3 mg/dL (ref 0.0–0.3)
TOTAL PROTEIN: 7.6 g/dL (ref 6.0–8.3)
Total Bilirubin: 1.5 mg/dL — ABNORMAL HIGH (ref 0.2–1.2)

## 2016-11-12 LAB — LIPID PANEL
Cholesterol: 186 mg/dL (ref 0–200)
HDL: 71.4 mg/dL (ref >39.00)
LDL Cholesterol: 98 mg/dL (ref 0–99)
NONHDL: 114.4
Total CHOL/HDL Ratio: 3
Triglycerides: 81 mg/dL (ref 0.0–149.0)
VLDL: 16.2 mg/dL (ref 0.0–40.0)

## 2016-11-12 LAB — PSA: PSA: 1.79 ng/mL (ref 0.10–4.00)

## 2016-11-12 LAB — TSH: TSH: 1.53 u[IU]/mL (ref 0.35–4.50)

## 2016-11-12 MED ORDER — ALLOPURINOL 300 MG PO TABS: ORAL_TABLET | ORAL | 3 refills | Status: DC

## 2016-11-12 MED ORDER — ATENOLOL-CHLORTHALIDONE 50-25 MG PO TABS: 0.5000 | ORAL_TABLET | Freq: Every day | ORAL | 3 refills | Status: DC

## 2016-11-12 NOTE — Progress Notes (Signed)
Patient presents to clinic today to establish care. He is a pleasant 76 year old male who  has a past medical history of Gout and Hypertension.   Acute Concerns: Establish Care/Complete Physical   Chronic Issues: Hypertension - Takes Tenoreic 5-25 mg daily.   Gout - Allopurinol 300mg    Health Maintenance: Dental --Routine  Vision -- Does not do routine care  Immunizations -- Colonoscopy -- 2005  Diet: " ok"  Exercise: walks   Hi sonly complaint today is that of left knee pain that he injured while in the [employer]. He reports seeing an orthopedic doctor 7-8 years ago and he was told to wait. The pain and feeling as though it is " catching"  Has been becoming worse. He would like to go back to orthopedics.   Past Medical History:  Diagnosis Date  . Gout   . Hypertension     No past surgical history on file.  Current Outpatient Prescriptions on File Prior to Visit  Medication Sig Dispense Refill  . allopurinol (ZYLOPRIM) 300 MG tablet TAKE 1 TABLET BY MOUTH DAILY. 100 tablet 3  . atenolol-chlorthalidone (TENORETIC) 50-25 MG tablet Take 0.5 tablets by mouth daily. 50 tablet 3   No current facility-administered medications on file prior to visit.     No Known Allergies  Family History  Problem Relation Age of Onset  . Gout Unknown        fhx  . Hypertension Unknown        fhx    Social History   Social History  . Marital status: Single    Spouse name: N/A  . Number of children: N/A  . Years of education: N/A   Occupational History  . Not on file.   Social History Main Topics  . Smoking status: Never Smoker  . Smokeless tobacco: Never Used  . Alcohol use Yes  . Drug use: No  . Sexual activity: Not on file   Other Topics Concern  . Not on file   Social History Narrative  . No narrative on file    Review of Systems  Constitutional: Negative.   HENT: Negative.   Eyes: Negative.   Respiratory: Negative.   Cardiovascular: Negative.     Gastrointestinal: Negative.   Genitourinary: Negative.   Musculoskeletal: Positive for joint pain.  Skin: Negative.   Neurological: Negative.   Endo/Heme/Allergies: Negative.   Psychiatric/Behavioral: Negative.   All other systems reviewed and are negative.   BP 124/68 (BP Location: Left Arm, Patient Position: Sitting, Cuff Size: Normal)   Temp 98.1 F (36.7 C) (Oral)   Ht 6\' 1"  (1.854 m)   Wt 160 lb 8 oz (72.8 kg)   BMI 21.18 kg/m   Physical Exam  Constitutional: He is oriented to person, place, and time and well-developed, well-nourished, and in no distress. No distress.  HENT:  Head: Normocephalic and atraumatic.  Right Ear: External ear normal.  Left Ear: External ear normal.  Nose: Nose normal.  Mouth/Throat: Oropharynx is clear and moist. No oropharyngeal exudate.  Eyes: Conjunctivae and EOM are normal. Pupils are equal, round, and reactive to light. Right eye exhibits no discharge. Left eye exhibits no discharge. No scleral icterus.  Neck: Normal range of motion. Neck supple. No thyromegaly present.  Cardiovascular: Normal rate, regular rhythm, normal heart sounds and intact distal pulses.  Exam reveals no gallop and no friction rub.   No murmur heard. Pulmonary/Chest: Effort normal and breath sounds normal.  Abdominal: Soft. Bowel sounds are  normal. He exhibits no distension and no mass. There is no tenderness. There is no rebound and no guarding.  Genitourinary:  Genitourinary Comments: Refused  Musculoskeletal: Normal range of motion. He exhibits edema (trace edema to left knee). He exhibits no tenderness or deformity.  Lymphadenopathy:    He has no cervical adenopathy.  Neurological: He is alert and oriented to person, place, and time. He has normal reflexes. He displays normal reflexes. No cranial nerve deficit. Gait normal. GCS score is 15.  Skin: Skin is warm and dry. No rash noted. He is not diaphoretic. No erythema. No pallor.  Psychiatric: Mood, memory,  affect and judgment normal.  Nursing note and vitals reviewed.  Assessment/Plan: 1. Routine general medical examination at a health care facility - Encouraged a heart healthy diet and frequent exercise - Basic metabolic panel - CBC with Differential/Platelet - Hepatic function panel - Lipid panel - PSA - TSH  2. Essential hypertension - Well controlled on current medications - atenolol-chlorthalidone (TENORETIC) 50-25 MG tablet; Take 0.5 tablets by mouth daily.  Dispense: 50 tablet; Refill: 3 - Basic metabolic panel - CBC with Differential/Platelet - Hepatic function panel - Lipid panel - PSA - TSH  3. Chronic pain of left knee  - AMB referral to orthopedics - DG Knee 1-2 Views Left; Future  4. Medication refill  - atenolol-chlorthalidone (TENORETIC) 50-25 MG tablet; Take 0.5 tablets by mouth daily.  Dispense: 50 tablet; Refill: 3 - allopurinol (ZYLOPRIM) 300 MG tablet; TAKE 1 TABLET BY MOUTH DAILY.  Dispense: 100 tablet; Refill: 3  Dorothyann Peng, NP

## 2016-11-12 NOTE — Patient Instructions (Signed)
It was great meeting you today   I will follow up with you regarding your blood work   Someone will call you about your appointment with orthopedics

## 2016-11-12 NOTE — Addendum Note (Signed)
Addended by: Apolinar Junes on: 11/12/2016 08:18 AM   Modules accepted: Orders

## 2016-11-16 ENCOUNTER — Encounter: Payer: Commercial Managed Care - PPO | Admitting: Adult Health

## 2016-11-25 ENCOUNTER — Encounter: Payer: Commercial Managed Care - PPO | Admitting: Adult Health

## 2016-11-26 ENCOUNTER — Telehealth: Payer: Self-pay | Admitting: Family Medicine

## 2016-11-26 NOTE — Telephone Encounter (Signed)
If his knee is now swollen and he is having a hard time walking then he needs to be seen, probably at urgent care at this point.   I can send him to orthopedics as well. But he should be seen for the acute issue

## 2016-11-26 NOTE — Telephone Encounter (Signed)
Please advise 

## 2016-11-26 NOTE — Telephone Encounter (Signed)
I contacted patient and notified him about Cory's comments.  He states that he is at work and will try to go to Urgent Care when he get's off. Patient sounded very rushed and stated he will call back about orthopedic referral.

## 2016-11-26 NOTE — Telephone Encounter (Signed)
Pt states his knee is now swollen and he can barely walk.  Cory sent him for Xray.  Pt would like to know what xray results were and what he should do next

## 2016-11-30 ENCOUNTER — Encounter: Payer: Self-pay | Admitting: Gastroenterology

## 2016-12-03 ENCOUNTER — Ambulatory Visit (AMBULATORY_SURGERY_CENTER): Payer: Self-pay

## 2016-12-03 ENCOUNTER — Other Ambulatory Visit: Payer: Self-pay | Admitting: Family Medicine

## 2016-12-03 ENCOUNTER — Encounter: Payer: Commercial Managed Care - PPO | Admitting: Gastroenterology

## 2016-12-03 VITALS — Ht 74.0 in | Wt 158.2 lb

## 2016-12-03 DIAGNOSIS — Z1211 Encounter for screening for malignant neoplasm of colon: Secondary | ICD-10-CM

## 2016-12-03 MED ORDER — SUPREP BOWEL PREP KIT 17.5-3.13-1.6 GM/177ML PO SOLN: 1.0000 | Freq: Once | ORAL | 0 refills | Status: AC

## 2016-12-03 NOTE — Progress Notes (Signed)
No allergies to eggs or soy No diet meds No home oxygen No past problems with anesthesia  Declined emmi 

## 2016-12-03 NOTE — Telephone Encounter (Signed)
Ok to refill both for one year

## 2016-12-07 ENCOUNTER — Encounter: Payer: Self-pay | Admitting: Gastroenterology

## 2016-12-17 ENCOUNTER — Ambulatory Visit (AMBULATORY_SURGERY_CENTER): Payer: Commercial Managed Care - PPO | Admitting: Gastroenterology

## 2016-12-17 ENCOUNTER — Encounter: Payer: Self-pay | Admitting: Gastroenterology

## 2016-12-17 VITALS — BP 136/73 | HR 46 | Temp 98.6°F | Resp 13 | Ht 74.0 in | Wt 158.0 lb

## 2016-12-17 DIAGNOSIS — Z1211 Encounter for screening for malignant neoplasm of colon: Secondary | ICD-10-CM

## 2016-12-17 DIAGNOSIS — D122 Benign neoplasm of ascending colon: Secondary | ICD-10-CM

## 2016-12-17 DIAGNOSIS — Z1212 Encounter for screening for malignant neoplasm of rectum: Secondary | ICD-10-CM

## 2016-12-17 MED ORDER — SODIUM CHLORIDE 0.9 % IV SOLN: 500.0000 mL | INTRAVENOUS | Status: DC

## 2016-12-17 NOTE — Progress Notes (Signed)
Called to room to assist during endoscopic procedure.  Patient ID and intended procedure confirmed with present staff. Received instructions for my participation in the procedure from the performing physician.  

## 2016-12-17 NOTE — Progress Notes (Signed)
Alert and oriented x3, pleased with MAC, report to RN Opal Sidles

## 2016-12-17 NOTE — Op Note (Signed)
[facility] Patient Name: Leroy Fuller Procedure Date: 12/17/2016 1:22 PM MRN: [id number] Endoscopist: Mallie Mussel L. Loletha Carrow , MD Age: 76 Referring MD:  Date of Birth: Mar 05, 1941 Gender: Male Account #: [id number] Procedure:                Colonoscopy Indications:              Screening for colorectal malignant neoplasm Medicines:                Monitored Anesthesia Care Procedure:                Pre-Anesthesia Assessment:                           - Prior to the procedure, a History and Physical                            was performed, and patient medications and                            allergies were reviewed. The patient's tolerance of                            previous anesthesia was also reviewed. The risks                            and benefits of the procedure and the sedation                            options and risks were discussed with the patient.                            All questions were answered, and informed consent                            was obtained. Prior Anticoagulants: The patient has                            taken no previous anticoagulant or antiplatelet                            agents. ASA Grade Assessment: II - A patient with                            mild systemic disease. After reviewing the risks                            and benefits, the patient was deemed in                            satisfactory condition to undergo the procedure.                           After obtaining informed consent, the colonoscope  was passed under direct vision. Throughout the                            procedure, the patient's blood pressure, pulse, and                            oxygen saturations were monitored continuously. The                            Colonoscope was introduced through the anus and                            advanced to the the cecum, identified by                            appendiceal orifice and  ileocecal valve. The                            colonoscopy was performed with moderate difficulty                            due to significant looping. Successful completion                            of the procedure was aided by changing the patient                            to a supine position and using manual pressure. The                            patient tolerated the procedure well. The quality                            of the bowel preparation was excellent. The                            ileocecal valve, appendiceal orifice, and rectum                            were photographed. The quality of the bowel                            preparation was evaluated using the BBPS [facility]                            Bowel Preparation Scale) with scores of: Right                            Colon = 3, Transverse Colon = 3 and Left Colon = 3                            (entire mucosa seen well with no residual staining,  small fragments of stool or opaque liquid). The                            total BBPS score equals 9. The bowel preparation                            used was SUPREP. Scope In: 1:27:39 PM Scope Out: 1:42:40 PM Scope Withdrawal Time: 0 hours 10 minutes 24 seconds  Total Procedure Duration: 0 hours 15 minutes 1 second  Findings:                 The digital rectal exam findings include enlarged                            prostate without palpable nodules.                           A 2 mm polyp was found in the distal ascending                            colon. The polyp was sessile. The polyp was removed                            with a cold snare. Resection and retrieval were                            complete.                           The exam was otherwise without abnormality on                            direct and retroflexion views. Complications:            No immediate complications. Estimated Blood Loss:     Estimated blood loss:  none. Impression:               - Enlarged prostate found on digital rectal exam.                           - One 2 mm polyp in the distal ascending colon,                            removed with a cold snare. Resected and retrieved.                           - The examination was otherwise normal on direct                            and retroflexion views. Recommendation:           - Patient has a contact number available for                            emergencies. The signs and symptoms of potential  delayed complications were discussed with the                            patient. Return to normal activities tomorrow.                            Written discharge instructions were provided to the                            patient.                           - Resume previous diet.                           - Continue present medications.                           - Await pathology results.                           - No repeat routine colonoscopy due to age. Henry L. Loletha Carrow, MD 12/17/2016 1:49:46 PM This report has been signed electronically.

## 2016-12-17 NOTE — Patient Instructions (Signed)
YOU HAD AN ENDOSCOPIC PROCEDURE TODAY AT THE [facility]:   Refer to the procedure report that was given to you for any specific questions about what was found during the examination.  If the procedure report does not answer your questions, please call your gastroenterologist to clarify.  If you requested that your care partner not be given the details of your procedure findings, then the procedure report has been included in a sealed envelope for you to review at your convenience later.  YOU SHOULD EXPECT: Some feelings of bloating in the abdomen. Passage of more gas than usual.  Walking can help get rid of the air that was put into your GI tract during the procedure and reduce the bloating. If you had a lower endoscopy (such as a colonoscopy or flexible sigmoidoscopy) you may notice spotting of blood in your stool or on the toilet paper. If you underwent a bowel prep for your procedure, you may not have a normal bowel movement for a few days.  Please Note:  You might notice some irritation and congestion in your nose or some drainage.  This is from the oxygen used during your procedure.  There is no need for concern and it should clear up in a day or so.  SYMPTOMS TO REPORT IMMEDIATELY:   Following lower endoscopy (colonoscopy or flexible sigmoidoscopy):  Excessive amounts of blood in the stool  Significant tenderness or worsening of abdominal pains  Swelling of the abdomen that is new, acute  Fever of 100F or higher   For urgent or emergent issues, a gastroenterologist can be reached at any hour by calling [phone].   DIET:  We do recommend a small meal at first, but then you may proceed to your regular diet.  Drink plenty of fluids but you should avoid alcoholic beverages for 24 hours.  ACTIVITY:  You should plan to take it easy for the rest of today and you should NOT DRIVE or use heavy machinery until tomorrow (because of the sedation medicines used during the test).     FOLLOW UP: Our staff will call the number listed on your records the next business day following your procedure to check on you and address any questions or concerns that you may have regarding the information given to you following your procedure. If we do not reach you, we will leave a message.  However, if you are feeling well and you are not experiencing any problems, there is no need to return our call.  We will assume that you have returned to your regular daily activities without incident.  If any biopsies were taken you will be contacted by phone or by letter within the next 1-3 weeks.  Please call us at [phone] if you have not heard about the biopsies in 3 weeks.    SIGNATURES/CONFIDENTIALITY: You and/or your care partner have signed paperwork which will be entered into your electronic medical record.  These signatures attest to the fact that that the information above on your After Visit Summary has been reviewed and is understood.  Full responsibility of the confidentiality of this discharge information lies with you and/or your care-partner.  Polyp information given. 

## 2016-12-20 ENCOUNTER — Telehealth: Payer: Self-pay

## 2016-12-20 NOTE — Telephone Encounter (Signed)
  Follow up Call-  Call back number 12/17/2016  Post procedure Call Back phone  # [phone]  Permission to leave phone message Yes  Some recent data might be hidden     Patient questions:  Do you have a fever, pain , or abdominal swelling? No. Pain Score  0 *  Have you tolerated food without any problems? Yes.    Have you been able to return to your normal activities? Yes.    Do you have any questions about your discharge instructions: Diet   No. Medications  No. Follow up visit  No.  Do you have questions or concerns about your Care? No.  Actions: * If pain score is 4 or above: No action needed, pain <4.  No problems noted per pt. maw

## 2016-12-21 ENCOUNTER — Encounter: Payer: Self-pay | Admitting: Gastroenterology

## 2017-02-11 ENCOUNTER — Telehealth: Payer: Self-pay | Admitting: Adult Health

## 2017-02-11 NOTE — Telephone Encounter (Signed)
He does not need a referral for optometry. Have him call his insurance and they will give him a list of providers in his network

## 2017-02-11 NOTE — Telephone Encounter (Signed)
Pt state that he has cloudy vision and Dr Sherren Mocha told him years ago that he may have cataract.

## 2017-02-11 NOTE — Telephone Encounter (Signed)
Pt would like to have a referral to a optometrist.

## 2017-02-11 NOTE — Telephone Encounter (Signed)
Need reason for referral.

## 2017-06-23 ENCOUNTER — Encounter: Payer: Self-pay | Admitting: Adult Health

## 2017-06-23 ENCOUNTER — Ambulatory Visit: Payer: Commercial Managed Care - PPO | Admitting: Adult Health

## 2017-06-23 VITALS — BP 132/64 | HR 56 | Temp 97.7°F | Ht 74.0 in | Wt 165.2 lb

## 2017-06-23 DIAGNOSIS — M542 Cervicalgia: Secondary | ICD-10-CM | POA: Diagnosis not present

## 2017-06-23 MED ORDER — CYCLOBENZAPRINE HCL 10 MG PO TABS: 10.0000 mg | ORAL_TABLET | Freq: Every day | ORAL | 0 refills | Status: AC

## 2017-06-23 MED ORDER — METHYLPREDNISOLONE 4 MG PO TBPK: ORAL_TABLET | ORAL | 0 refills | Status: DC

## 2017-06-23 MED ORDER — IBUPROFEN 600 MG PO TABS: 600.0000 mg | ORAL_TABLET | Freq: Three times a day (TID) | ORAL | 0 refills | Status: AC | PRN

## 2017-06-23 NOTE — Progress Notes (Signed)
Subjective:    Patient ID: BRALLAN DENIO, male    DOB: [birth year], 76 y.o.   MRN: [id number]  HPI 76 year old male who  has a past medical history of Gout and Hypertension.  He presents to the office today for the acute complaint of stiff neck. He reports that he woke up five days ago with neck pain. The pain is described as "aching". He denies any fevers, headaches, or blurred vision. He has no temporal pain. Discomfort is when he turns his head horizontally as well as looks to the ground. No pain with looking up.   Denies any trauma or numbness/tingling down arms.   Has not been using at OTC medications. Warm showers have not helped     Review of Systems See HPI   Past Medical History:  Diagnosis Date  . Gout   . Hypertension     Social History   Socioeconomic History  . Marital status: Single    Spouse name: Not on file  . Number of children: Not on file  . Years of education: Not on file  . Highest education level: Not on file  Social Needs  . Financial resource strain: Not on file  . Food insecurity - worry: Not on file  . Food insecurity - inability: Not on file  . Transportation needs - medical: Not on file  . Transportation needs - non-medical: Not on file  Occupational History  . Not on file  Tobacco Use  . Smoking status: Never Smoker  . Smokeless tobacco: Never Used  Substance and Sexual Activity  . Alcohol use: Yes    Comment: social   . Drug use: No  . Sexual activity: Not on file  Other Topics Concern  . Not on file  Social History Narrative   He continues to work at the [employer]   Not married   One child - lives locally        Past Surgical History:  Procedure Laterality Date  . DENTAL RESTORATION/EXTRACTION WITH X-RAY      Family History  Problem Relation Age of Onset  . Gout Unknown        fhx  . Hypertension Unknown        fhx  . Colon cancer Neg Hx     No Known Allergies  Current Outpatient Medications on File Prior to  Visit  Medication Sig Dispense Refill  . allopurinol (ZYLOPRIM) 300 MG tablet TAKE 1 TABLET BY MOUTH DAILY. 90 tablet 3  . atenolol-chlorthalidone (TENORETIC) 50-25 MG tablet Take 0.5 tablets by mouth daily. 50 tablet 3   Current Facility-Administered Medications on File Prior to Visit  Medication Dose Route Frequency Provider Last Rate Last Dose  . 0.9 %  sodium chloride infusion  500 mL Intravenous Continuous Danis, Estill Cotta III, MD        BP 132/64 (BP Location: Left Arm, Patient Position: Sitting, Cuff Size: Normal)   Pulse (!) 56   Temp 97.7 F (36.5 C) (Oral)   Ht 6\' 2"  (1.88 m)   Wt 165 lb 3.2 oz (74.9 kg)   SpO2 98%   BMI 21.21 kg/m       Objective:   Physical Exam  Constitutional: He is oriented to person, place, and time. He appears well-developed and well-nourished. No distress.  Cardiovascular: Normal rate, regular rhythm, normal heart sounds and intact distal pulses. Exam reveals no gallop and no friction rub.  No murmur heard. Pulmonary/Chest: Effort normal and breath sounds  normal. No respiratory distress. He has no wheezes. He has no rales. He exhibits no tenderness.  Musculoskeletal: He exhibits no edema, tenderness or deformity.  Is unable to look left or right without discomfort. He is unable to look down without discomfort. No pain with palpation.  No spinal tenderness   Neurological: He is alert and oriented to person, place, and time.  Skin: Skin is warm and dry. No rash noted. He is not diaphoretic. No erythema. No pallor.  Psychiatric: He has a normal mood and affect. His behavior is normal. Judgment and thought content normal.  Nursing note and vitals reviewed.     Assessment & Plan:  1. Neck pain - Exam appears to be muscular in nature. Will prescribe MSK relaxer and prednisone. He can use motrin 600 mg as needed. Add heating pad.  - cyclobenzaprine (FLEXERIL) 10 MG tablet; Take 1 tablet (10 mg total) by mouth at bedtime.  Dispense: 30 tablet; Refill:  0 - methylPREDNISolone (MEDROL DOSEPAK) 4 MG TBPK tablet; Take as directed  Dispense: 21 tablet; Refill: 0 - ibuprofen (ADVIL,MOTRIN) 600 MG tablet; Take 1 tablet (600 mg total) by mouth every 8 (eight) hours as needed.  Dispense: 30 tablet; Refill: 0 - Follow up if not resolved in 3-4 days or sooner if needed - Consider imaging at this time   Dorothyann Peng, NP

## 2017-07-19 ENCOUNTER — Other Ambulatory Visit: Payer: Self-pay | Admitting: Adult Health

## 2017-07-19 DIAGNOSIS — M542 Cervicalgia: Secondary | ICD-10-CM

## 2017-07-19 NOTE — Telephone Encounter (Signed)
Does he still need this?

## 2017-07-19 NOTE — Telephone Encounter (Signed)
Last OV 06/23/2017  Rx was last refilled 06/23/2017 disp 30 with no refills  Sent to PCP for approval

## 2017-07-21 NOTE — Telephone Encounter (Signed)
Called home # and informed pt not home but will be back in 1 hour.  Will try again at a later time.

## 2017-08-08 NOTE — Telephone Encounter (Signed)
Tried reaching the pt.  No answer or machine at home #.  Will try again at a later time.

## 2017-12-09 ENCOUNTER — Other Ambulatory Visit: Payer: Self-pay | Admitting: Adult Health

## 2017-12-09 DIAGNOSIS — I10 Essential (primary) hypertension: Secondary | ICD-10-CM

## 2017-12-09 DIAGNOSIS — Z760 Encounter for issue of repeat prescription: Secondary | ICD-10-CM

## 2017-12-09 NOTE — Telephone Encounter (Signed)
Needs annual visit

## 2017-12-13 NOTE — Telephone Encounter (Signed)
Left a message for a return call.

## 2017-12-16 NOTE — Telephone Encounter (Signed)
Spoke to the pt and informed him that he is past due for cpx.  He will check his work schedule and call back.  30 days supply of medication sent to the pharmacy.

## 2018-01-13 ENCOUNTER — Other Ambulatory Visit: Payer: Self-pay | Admitting: Adult Health

## 2018-01-13 DIAGNOSIS — Z760 Encounter for issue of repeat prescription: Secondary | ICD-10-CM

## 2018-01-13 DIAGNOSIS — I10 Essential (primary) hypertension: Secondary | ICD-10-CM

## 2018-01-13 NOTE — Telephone Encounter (Signed)
30 day supply sent to the pharmacy by e-scribe.  Pt has cpx scheduled for 01/25/18.

## 2018-01-25 ENCOUNTER — Ambulatory Visit (INDEPENDENT_AMBULATORY_CARE_PROVIDER_SITE_OTHER): Payer: Commercial Managed Care - PPO | Admitting: Adult Health

## 2018-01-25 ENCOUNTER — Encounter: Payer: Self-pay | Admitting: Adult Health

## 2018-01-25 VITALS — BP 118/62 | Temp 97.8°F | Ht 72.5 in | Wt 157.0 lb

## 2018-01-25 DIAGNOSIS — Z0000 Encounter for general adult medical examination without abnormal findings: Secondary | ICD-10-CM | POA: Diagnosis not present

## 2018-01-25 DIAGNOSIS — M10012 Idiopathic gout, left shoulder: Secondary | ICD-10-CM

## 2018-01-25 DIAGNOSIS — Z125 Encounter for screening for malignant neoplasm of prostate: Secondary | ICD-10-CM | POA: Diagnosis not present

## 2018-01-25 DIAGNOSIS — I10 Essential (primary) hypertension: Secondary | ICD-10-CM | POA: Diagnosis not present

## 2018-01-25 DIAGNOSIS — Z760 Encounter for issue of repeat prescription: Secondary | ICD-10-CM

## 2018-01-25 LAB — CBC WITH DIFFERENTIAL/PLATELET
Basophils Absolute: 0 10*3/uL (ref 0.0–0.1)
Basophils Relative: 1 % (ref 0.0–3.0)
EOS PCT: 2.2 % (ref 0.0–5.0)
Eosinophils Absolute: 0.1 10*3/uL (ref 0.0–0.7)
HCT: 40.8 % (ref 39.0–52.0)
Hemoglobin: 13.9 g/dL (ref 13.0–17.0)
LYMPHS ABS: 1.3 10*3/uL (ref 0.7–4.0)
Lymphocytes Relative: 39.6 % (ref 12.0–46.0)
MCHC: 34 g/dL (ref 30.0–36.0)
MCV: 98.8 fl (ref 78.0–100.0)
MONO ABS: 0.5 10*3/uL (ref 0.1–1.0)
Monocytes Relative: 14.7 % — ABNORMAL HIGH (ref 3.0–12.0)
NEUTROS PCT: 42.5 % — AB (ref 43.0–77.0)
Neutro Abs: 1.4 10*3/uL (ref 1.4–7.7)
Platelets: 222 10*3/uL (ref 150.0–400.0)
RBC: 4.13 Mil/uL — ABNORMAL LOW (ref 4.22–5.81)
RDW: 13.2 % (ref 11.5–15.5)
WBC: 3.4 10*3/uL — ABNORMAL LOW (ref 4.0–10.5)

## 2018-01-25 LAB — BASIC METABOLIC PANEL
BUN: 11 mg/dL (ref 6–23)
CALCIUM: 9.2 mg/dL (ref 8.4–10.5)
CO2: 34 mEq/L — ABNORMAL HIGH (ref 19–32)
CREATININE: 0.99 mg/dL (ref 0.40–1.50)
Chloride: 98 mEq/L (ref 96–112)
GFR: 94.72 mL/min (ref >60.00)
GLUCOSE: 99 mg/dL (ref 70–99)
POTASSIUM: 3.4 meq/L — AB (ref 3.5–5.1)
Sodium: 138 mEq/L (ref 135–145)

## 2018-01-25 LAB — LIPID PANEL
CHOLESTEROL: 176 mg/dL (ref 0–200)
HDL: 50.8 mg/dL (ref >39.00)
LDL CALC: 106 mg/dL — AB (ref 0–99)
NonHDL: 125.05
TRIGLYCERIDES: 94 mg/dL (ref 0.0–149.0)
Total CHOL/HDL Ratio: 3
VLDL: 18.8 mg/dL (ref 0.0–40.0)

## 2018-01-25 LAB — PSA: PSA: 1.74 ng/mL (ref 0.10–4.00)

## 2018-01-25 LAB — HEPATIC FUNCTION PANEL
ALT: 17 U/L (ref 0–53)
AST: 23 U/L (ref 0–37)
Albumin: 4.1 g/dL (ref 3.5–5.2)
Alkaline Phosphatase: 66 U/L (ref 39–117)
BILIRUBIN TOTAL: 1.5 mg/dL — AB (ref 0.2–1.2)
Bilirubin, Direct: 0.3 mg/dL (ref 0.0–0.3)
Total Protein: 7.6 g/dL (ref 6.0–8.3)

## 2018-01-25 LAB — TSH: TSH: 1.59 u[IU]/mL (ref 0.35–4.50)

## 2018-01-25 MED ORDER — ALLOPURINOL 300 MG PO TABS: 300.0000 mg | ORAL_TABLET | Freq: Every day | ORAL | 3 refills | Status: AC

## 2018-01-25 MED ORDER — ATENOLOL-CHLORTHALIDONE 50-25 MG PO TABS: 0.5000 | ORAL_TABLET | Freq: Every day | ORAL | 3 refills | Status: AC

## 2018-01-25 NOTE — Progress Notes (Signed)
Subjective:    Patient ID: Leroy Fuller, male    DOB: June 24, 1941, 77 y.o.   MRN: [id number]  HPI  Patient presents for yearly preventative medicine examination. He is a pleasant 77 year old male who  has a past medical history of Gout and Hypertension. He continues to work at [employer].   Gout - Currently prescribed Allopurinol 300 mg daily. No recent gout flares   Essential Hypertension - Prescribed Tenoretic 50-25 mg - well controlled.  BP Readings from Last 3 Encounters:  01/25/18 118/62  06/23/17 132/64  12/17/16 136/73    All immunizations and health maintenance protocols were reviewed with the patient and needed orders were placed.  Appropriate screening laboratory values were ordered for the patient including screening of hyperlipidemia, renal function and hepatic function. If indicated by BPH, a PSA was ordered.  Medication reconciliation,  past medical history, social history, problem list and allergies were reviewed in detail with the patient  Goals were established with regard to weight loss, exercise, and  diet in compliance with medications Wt Readings from Last 3 Encounters:  01/25/18 157 lb (71.2 kg)  06/23/17 165 lb 3.2 oz (74.9 kg)  12/17/16 158 lb (71.7 kg)   End of life planning was discussed. He does not have an advanced directive or living. He would like this form.   He is up-to-date on his colonoscopy in routine dental exams.  He does not participate in vision screens.  He has no acute complaints today   Review of Systems  Constitutional: Negative.   HENT: Negative.   Eyes: Negative.   Respiratory: Negative.   Cardiovascular: Negative.   Gastrointestinal: Negative.   Endocrine: Negative.   Genitourinary: Negative.   Musculoskeletal: Negative.   Skin: Negative.   Allergic/Immunologic: Negative.   Neurological: Negative.   Hematological: Negative.   Psychiatric/Behavioral: Negative.   All other systems reviewed and are negative.  Past  Medical History:  Diagnosis Date  . Gout   . Hypertension     Social History   Socioeconomic History  . Marital status: Single    Spouse name: Not on file  . Number of children: Not on file  . Years of education: Not on file  . Highest education level: Not on file  Occupational History  . Not on file  Social Needs  . Financial resource strain: Not on file  . Food insecurity:    Worry: Not on file    Inability: Not on file  . Transportation needs:    Medical: Not on file    Non-medical: Not on file  Tobacco Use  . Smoking status: Never Smoker  . Smokeless tobacco: Never Used  Substance and Sexual Activity  . Alcohol use: Yes    Comment: social   . Drug use: No  . Sexual activity: Not on file  Lifestyle  . Physical activity:    Days per week: Not on file    Minutes per session: Not on file  . Stress: Not on file  Relationships  . Social connections:    Talks on phone: Not on file    Gets together: Not on file    Attends religious service: Not on file    Active member of club or organization: Not on file    Attends meetings of clubs or organizations: Not on file    Relationship status: Not on file  . Intimate partner violence:    Fear of current or ex partner: Not on file  Emotionally abused: Not on file    Physically abused: Not on file    Forced sexual activity: Not on file  Other Topics Concern  . Not on file  Social History Narrative   He continues to work at the [employer]   Not married   One child - lives locally        Past Surgical History:  Procedure Laterality Date  . DENTAL RESTORATION/EXTRACTION WITH X-RAY      Family History  Problem Relation Age of Onset  . Gout Unknown        fhx  . Hypertension Unknown        fhx  . Colon cancer Neg Hx     No Known Allergies  Current Outpatient Medications on File Prior to Visit  Medication Sig Dispense Refill  . allopurinol (ZYLOPRIM) 300 MG tablet TAKE 1 TABLET BY MOUTH EVERY DAY 30 tablet 0    . atenolol-chlorthalidone (TENORETIC) 50-25 MG tablet TAKE 0.5 TABLETS BY MOUTH DAILY. 15 tablet 0  . cyclobenzaprine (FLEXERIL) 10 MG tablet Take 1 tablet (10 mg total) by mouth at bedtime. 30 tablet 0  . ibuprofen (ADVIL,MOTRIN) 600 MG tablet Take 1 tablet (600 mg total) by mouth every 8 (eight) hours as needed. 30 tablet 0   No current facility-administered medications on file prior to visit.     BP 118/62   Temp 97.8 F (36.6 C) (Oral)   Ht 6' 0.5" (1.842 m)   Wt 157 lb (71.2 kg)   BMI 21.00 kg/m       Objective:   Physical Exam  Constitutional: He is oriented to person, place, and time. He appears well-developed and well-nourished. No distress.  HENT:  Head: Normocephalic and atraumatic.  Right Ear: External ear normal.  Left Ear: External ear normal.  Nose: Nose normal.  Mouth/Throat: Oropharynx is clear and moist. No oropharyngeal exudate.  Eyes: Pupils are equal, round, and reactive to light. Conjunctivae and EOM are normal. Right eye exhibits no discharge. Left eye exhibits no discharge. No scleral icterus.  Neck: Normal range of motion. Neck supple. No JVD present. No tracheal deviation present. No thyromegaly present.  Cardiovascular: Normal rate, regular rhythm, normal heart sounds and intact distal pulses. Exam reveals no gallop and no friction rub.  No murmur heard. Pulmonary/Chest: Effort normal and breath sounds normal. No stridor. No respiratory distress. He has no wheezes. He has no rales. He exhibits no tenderness.  Abdominal: Soft. Bowel sounds are normal. He exhibits no distension and no mass. There is no tenderness. There is no rebound and no guarding. No hernia.  Musculoskeletal: Normal range of motion. He exhibits no edema, tenderness or deformity.  Lymphadenopathy:    He has no cervical adenopathy.  Neurological: He is alert and oriented to person, place, and time. He displays normal reflexes. No cranial nerve deficit or sensory deficit. He exhibits  normal muscle tone. Coordination normal.  Skin: Skin is warm and dry. Capillary refill takes less than 2 seconds. No rash noted. He is not diaphoretic. No erythema. No pallor.  Psychiatric: He has a normal mood and affect. His behavior is normal. Judgment and thought content normal.  Nursing note and vitals reviewed.     Assessment & Plan:  1. Routine general medical examination at a health care facility - Benign exam. Remarkable gentleman.  - Follow up in one year or sooner if needed - Basic metabolic panel - CBC with Differential/Platelet - Hepatic function panel - Lipid panel - TSH  2. Acute idiopathic gout of left shoulder - Continue with Allopurinol.  - Follow up as needed  3. Essential hypertension - Well controlled. No change in medications at thsi time  - Basic metabolic panel - CBC with Differential/Platelet - Hepatic function panel - Lipid panel - TSH - atenolol-chlorthalidone (TENORETIC) 50-25 MG tablet; Take 0.5 tablets by mouth daily.  Dispense: 90 tablet; Refill: 3  4. Prostate cancer screening  - PSA  5. Medication refill  - allopurinol (ZYLOPRIM) 300 MG tablet; Take 1 tablet (300 mg total) by mouth daily.  Dispense: 90 tablet; Refill: 3 - atenolol-chlorthalidone (TENORETIC) 50-25 MG tablet; Take 0.5 tablets by mouth daily.  Dispense: 90 tablet; Refill: 3   Dorothyann Peng, NP

## 2021-09-08 ENCOUNTER — Emergency Department (HOSPITAL_COMMUNITY)
Admission: EM | Admit: 2021-09-08 | Discharge: 2021-09-09 | Disposition: A | Discharge status: Home / Self Care | Payer: No Typology Code available for payment source | Attending: Emergency Medicine | Admitting: Emergency Medicine

## 2021-09-08 ENCOUNTER — Other Ambulatory Visit: Payer: Self-pay

## 2021-09-08 ENCOUNTER — Emergency Department (HOSPITAL_COMMUNITY): Payer: No Typology Code available for payment source

## 2021-09-08 DIAGNOSIS — R066 Hiccough: Secondary | ICD-10-CM | POA: Diagnosis not present

## 2021-09-08 DIAGNOSIS — R0602 Shortness of breath: Secondary | ICD-10-CM | POA: Insufficient documentation

## 2021-09-08 DIAGNOSIS — R0600 Dyspnea, unspecified: Secondary | ICD-10-CM

## 2021-09-08 DIAGNOSIS — R0789 Other chest pain: Secondary | ICD-10-CM | POA: Diagnosis not present

## 2021-09-08 LAB — CBC
HCT: 35.2 % — ABNORMAL LOW (ref 39.0–52.0)
Hemoglobin: 11.4 g/dL — ABNORMAL LOW (ref 13.0–17.0)
MCH: 31.2 pg (ref 26.0–34.0)
MCHC: 32.4 g/dL (ref 30.0–36.0)
MCV: 96.4 fL (ref 80.0–100.0)
Platelets: 624 10*3/uL — ABNORMAL HIGH (ref 150–400)
RBC: 3.65 MIL/uL — ABNORMAL LOW (ref 4.22–5.81)
RDW: 13.2 % (ref 11.5–15.5)
WBC: 15.8 10*3/uL — ABNORMAL HIGH (ref 4.0–10.5)
nRBC: 0 % (ref 0.0–0.2)

## 2021-09-08 NOTE — ED Triage Notes (Signed)
Pt BIB EMS with c/o SOB that started about a week ago. Per pt, the SOB happens when he develops hiccups.  ?

## 2021-09-09 LAB — BASIC METABOLIC PANEL
Anion gap: 8 (ref 5–15)
BUN: 14 mg/dL (ref 8–23)
CO2: 28 mmol/L (ref 22–32)
Calcium: 8.8 mg/dL — ABNORMAL LOW (ref 8.9–10.3)
Chloride: 96 mmol/L — ABNORMAL LOW (ref 98–111)
Creatinine, Ser: 0.83 mg/dL (ref 0.61–1.24)
GFR, Estimated: >60 mL/min (ref >60)
Glucose, Bld: 133 mg/dL — ABNORMAL HIGH (ref 70–99)
Potassium: 3.4 mmol/L — ABNORMAL LOW (ref 3.5–5.1)
Sodium: 132 mmol/L — ABNORMAL LOW (ref 135–145)

## 2021-09-09 LAB — TROPONIN I (HIGH SENSITIVITY): Troponin I (High Sensitivity): 3 ng/L (ref <18)

## 2021-09-09 MED ORDER — ALBUTEROL SULFATE HFA 108 (90 BASE) MCG/ACT IN AERS
2.0000 | INHALATION_SPRAY | Freq: Once | RESPIRATORY_TRACT | Status: AC
  Administered 2021-09-09: 2 via RESPIRATORY_TRACT
  Filled 2021-09-09: qty 6.7

## 2021-09-09 MED ORDER — CHLORPROMAZINE HCL 25 MG PO TABS: 25.0000 mg | ORAL_TABLET | Freq: Three times a day (TID) | ORAL | 0 refills | Status: AC | PRN

## 2021-09-09 MED ORDER — CHLORPROMAZINE HCL 25 MG PO TABS
25.0000 mg | ORAL_TABLET | Freq: Once | ORAL | Status: AC
  Administered 2021-09-09: 25 mg via ORAL
  Filled 2021-09-09: qty 1

## 2021-09-09 NOTE — ED Provider Notes (Signed)
?[place] DEPT ?Provider Note ? ? ?CSN: [id number] ?Arrival date & time: 09/08/21  2314 ? ?  ? ?History ? ?Chief Complaint  ?Patient presents with  ? Shortness of Breath  ? Hiccups  ? ? ?Leroy Fuller is a 81 y.o. male. ? ?Patient is a 81 year old male with past medical history of hypertension, gout, anemia.  He presents today for evaluation of hiccups and shortness of breath.  He describes a 1 week history of intermittent hiccups that cause discomfort across his chest and caused him to feel short of breath.  He denies any fevers, chills, or productive cough.  Patient was seen at the [facility] yesterday and prescribed antibiotics for urinary tract infection, but his hiccups were not addressed. ? ?The history is provided by the patient.  ?Shortness of Breath ?Severity:  Moderate ?Onset quality:  Gradual ?Duration:  1 week ?Timing:  Intermittent ?Progression:  Worsening ?Chronicity:  New ?Relieved by:  Nothing ?Worsened by:  Nothing ?Ineffective treatments:  None tried ? ?  ? ?Home Medications ?Prior to Admission medications   ?Medication Sig Start Date End Date Taking? Authorizing Provider  ?allopurinol (ZYLOPRIM) 300 MG tablet Take 1 tablet (300 mg total) by mouth daily. 01/25/18   Nafziger, Tommi Rumps, NP  ?atenolol-chlorthalidone (TENORETIC) 50-25 MG tablet Take 0.5 tablets by mouth daily. 01/25/18   Nafziger, Tommi Rumps, NP  ?cyclobenzaprine (FLEXERIL) 10 MG tablet Take 1 tablet (10 mg total) by mouth at bedtime. 06/23/17   Nafziger, Tommi Rumps, NP  ?ibuprofen (ADVIL,MOTRIN) 600 MG tablet Take 1 tablet (600 mg total) by mouth every 8 (eight) hours as needed. 06/23/17   Dorothyann Peng, NP  ?   ? ?Allergies    ?Patient has no known allergies.   ? ?Review of Systems   ?Review of Systems  ?Respiratory:  Positive for shortness of breath.   ?All other systems reviewed and are negative. ? ?Physical Exam ?Updated Vital Signs ?BP 138/74   Pulse 70   Temp 98.4 ?F (36.9 ?C) (Oral)   Resp 16   Ht 6' 0.5"  (1.842 m)   Wt 63.5 kg   SpO2 98%   BMI 18.73 kg/m?  ?Physical Exam ?Vitals and nursing note reviewed.  ?Constitutional:   ?   General: He is not in acute distress. ?   Appearance: He is well-developed. He is not diaphoretic.  ?HENT:  ?   Head: Normocephalic and atraumatic.  ?Cardiovascular:  ?   Rate and Rhythm: Normal rate and regular rhythm.  ?   Heart sounds: No murmur heard. ?  No friction rub.  ?Pulmonary:  ?   Effort: Pulmonary effort is normal. No respiratory distress.  ?   Breath sounds: Normal breath sounds. No wheezing or rales.  ?Abdominal:  ?   General: Bowel sounds are normal. There is no distension.  ?   Palpations: Abdomen is soft.  ?   Tenderness: There is no abdominal tenderness.  ?Musculoskeletal:     ?   General: Normal range of motion.  ?   Cervical back: Normal range of motion and neck supple.  ?   Right lower leg: No tenderness. No edema.  ?   Left lower leg: No tenderness. No edema.  ?Skin: ?   General: Skin is warm and dry.  ?Neurological:  ?   Mental Status: He is alert and oriented to person, place, and time.  ?   Coordination: Coordination normal.  ? ? ?ED Results / Procedures / Treatments   ?Labs ?(all  labs ordered are listed, but only abnormal results are displayed) ?Labs Reviewed  ?BASIC METABOLIC PANEL - Abnormal; Notable for the following components:  ?    Result Value  ? Sodium 132 (*)   ? Potassium 3.4 (*)   ? Chloride 96 (*)   ? Glucose, Bld 133 (*)   ? Calcium 8.8 (*)   ? All other components within normal limits  ?CBC - Abnormal; Notable for the following components:  ? WBC 15.8 (*)   ? RBC 3.65 (*)   ? Hemoglobin 11.4 (*)   ? HCT 35.2 (*)   ? Platelets 624 (*)   ? All other components within normal limits  ?TROPONIN I (HIGH SENSITIVITY)  ? ? ?EKG ?None ? ?Radiology ?DG Chest 2 View ? ?Result Date: 09/08/2021 ?CLINICAL DATA:  Shortness of breath and chest pain EXAM: CHEST - 2 VIEW COMPARISON:  01/18/2011 FINDINGS: Cardiac shadow is within normal limits. Lungs are hyperinflated.  No focal infiltrate or sizable effusion is seen. No acute bony abnormality is noted. 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 Electronically Signed   By: Inez Catalina M.D.   On: 09/08/2021 23:47   ? ?Procedures ?Procedures  ? ? ?Medications Ordered in ED ?Medications  ?albuterol (VENTOLIN HFA) 108 (90 Base) MCG/ACT inhaler 2 puff (has no administration in time range)  ? ? ?ED Course/ Medical Decision Making/ A&P ? ?This patient presents to the ED for concern of shortness of breath and hiccups, this involves an extensive number of treatment options, and is a complaint that carries with it a high risk of complications and morbidity.  The differential diagnosis includes pneumonia, acute coronary syndrome, neoplasm ? ? ?Co morbidities that complicate the patient evaluation ? ?None ? ? ?Additional history obtained: ? ?No additional history or external records needed ? ? ?Lab Tests: ? ?I Ordered, and personally interpreted labs.  The pertinent results include: Unremarkable CBC, basic metabolic panel, and troponin with a several day history of symptoms ? ? ?Imaging Studies ordered: ? ?I ordered imaging studies including chest x-ray ?I independently visualized and interpreted imaging which showed possible COPD, but no acute process ?I agree with the radiologist interpretation ? ? ?Cardiac Monitoring: ? ?The patient was maintained on a cardiac monitor.  I personally viewed and interpreted the cardiac monitored which showed an underlying rhythm of: Sinus ? ? ?Medicines ordered and prescription drug management: ? ?I ordered medication including Thorazine for hiccups and albuterol for dyspnea ?Reevaluation of the patient after these medicines showed that the patient improved ?I have reviewed the patients home medicines and have made adjustments as needed ? ? ?Test Considered: ? ?None ? ? ?Critical Interventions: ? ?None ? ? ?Consultations Obtained: ? ?No consultations obtained ? ? ?Problem List / ED Course: ? ?Patient  presenting here with complaints of shortness of breath and hiccups for the past several days.  He reports having discomfort in his chest when his hiccups are active.  Work-up shows no evidence for a cardiac etiology.  There are no acute findings on his chest x-ray.  Patient to be discharged with an albuterol inhaler and Thorazine.  To return as needed. ? ? ? ?Social Determinants of Health: ? ?None ? ? ? ? ?Final Clinical Impression(s) / ED Diagnoses ?Final diagnoses:  ?None  ? ? ?Rx / DC Orders ?ED Discharge Orders   ? ? None  ? ?  ? ? ?  ?Veryl Speak, MD ?09/09/21 0308 ? ?

## 2021-09-09 NOTE — Discharge Instructions (Signed)
Begin taking Thorazine as prescribed. ? ?Use your albuterol inhaler, 2 puffs every 4 hours as needed for wheezing/difficulty breathing. ? ?Return to the emergency department if symptoms significantly worsen or change. ?
